# Patient Record
Sex: Female | Born: 1960 | Race: White | Hispanic: No | Marital: Married | Smoking: Former smoker
Health system: Southern US, Community
[De-identification: ages and names within clinical notes are randomized; demographics above are authoritative.]

## PROBLEM LIST (undated history)

## (undated) DIAGNOSIS — E039 Hypothyroidism, unspecified: Secondary | ICD-10-CM

## (undated) HISTORY — PX: TUBAL LIGATION: SHX77

---

## 1998-03-21 ENCOUNTER — Other Ambulatory Visit: Admission: RE | Admit: 1998-03-21 | Discharge: 1998-03-21 | Payer: Self-pay | Admitting: Obstetrics and Gynecology

## 1998-08-22 ENCOUNTER — Ambulatory Visit (HOSPITAL_COMMUNITY): Admission: RE | Admit: 1998-08-22 | Discharge: 1998-08-22 | Payer: Self-pay | Admitting: Family Medicine

## 1998-08-22 ENCOUNTER — Encounter: Payer: Self-pay | Admitting: Family Medicine

## 1999-03-27 ENCOUNTER — Other Ambulatory Visit: Admission: RE | Admit: 1999-03-27 | Discharge: 1999-03-27 | Payer: Self-pay | Admitting: Obstetrics and Gynecology

## 2000-05-17 ENCOUNTER — Other Ambulatory Visit: Admission: RE | Admit: 2000-05-17 | Discharge: 2000-05-17 | Payer: Self-pay | Admitting: Obstetrics and Gynecology

## 2001-04-04 ENCOUNTER — Encounter: Payer: Self-pay | Admitting: Obstetrics and Gynecology

## 2001-04-04 ENCOUNTER — Ambulatory Visit (HOSPITAL_COMMUNITY): Admission: RE | Admit: 2001-04-04 | Discharge: 2001-04-04 | Payer: Self-pay | Admitting: Obstetrics and Gynecology

## 2001-05-23 ENCOUNTER — Other Ambulatory Visit: Admission: RE | Admit: 2001-05-23 | Discharge: 2001-05-23 | Payer: Self-pay | Admitting: Obstetrics and Gynecology

## 2002-09-11 ENCOUNTER — Other Ambulatory Visit: Admission: RE | Admit: 2002-09-11 | Discharge: 2002-09-11 | Payer: Self-pay | Admitting: Obstetrics and Gynecology

## 2003-04-03 ENCOUNTER — Ambulatory Visit (HOSPITAL_COMMUNITY): Admission: RE | Admit: 2003-04-03 | Discharge: 2003-04-03 | Payer: Self-pay | Admitting: Obstetrics and Gynecology

## 2004-07-07 ENCOUNTER — Ambulatory Visit (HOSPITAL_COMMUNITY): Admission: RE | Admit: 2004-07-07 | Discharge: 2004-07-07 | Payer: Self-pay | Admitting: Obstetrics and Gynecology

## 2005-10-15 ENCOUNTER — Ambulatory Visit (HOSPITAL_COMMUNITY): Admission: RE | Admit: 2005-10-15 | Discharge: 2005-10-15 | Payer: Self-pay | Admitting: Obstetrics and Gynecology

## 2007-05-12 ENCOUNTER — Ambulatory Visit (HOSPITAL_COMMUNITY): Admission: RE | Admit: 2007-05-12 | Discharge: 2007-05-12 | Payer: Self-pay | Admitting: Obstetrics and Gynecology

## 2008-08-22 ENCOUNTER — Ambulatory Visit (HOSPITAL_COMMUNITY): Admission: RE | Admit: 2008-08-22 | Discharge: 2008-08-22 | Payer: Self-pay | Admitting: Obstetrics and Gynecology

## 2009-01-26 HISTORY — PX: OTHER SURGICAL HISTORY: SHX169

## 2009-04-16 ENCOUNTER — Ambulatory Visit: Payer: Self-pay | Admitting: Family Medicine

## 2009-04-16 DIAGNOSIS — M940 Chondrocostal junction syndrome [Tietze]: Secondary | ICD-10-CM | POA: Insufficient documentation

## 2009-04-16 DIAGNOSIS — R079 Chest pain, unspecified: Secondary | ICD-10-CM | POA: Insufficient documentation

## 2009-04-17 ENCOUNTER — Encounter: Payer: Self-pay | Admitting: Family Medicine

## 2009-05-09 ENCOUNTER — Ambulatory Visit: Payer: Self-pay | Admitting: Family Medicine

## 2009-05-09 DIAGNOSIS — L723 Sebaceous cyst: Secondary | ICD-10-CM | POA: Insufficient documentation

## 2009-05-09 DIAGNOSIS — R635 Abnormal weight gain: Secondary | ICD-10-CM | POA: Insufficient documentation

## 2009-05-10 ENCOUNTER — Encounter: Payer: Self-pay | Admitting: Family Medicine

## 2009-05-13 ENCOUNTER — Encounter: Payer: Self-pay | Admitting: Family Medicine

## 2009-05-13 LAB — CONVERTED CEMR LAB
Albumin: 4.5 g/dL (ref 3.5–5.2)
Alkaline Phosphatase: 82 units/L (ref 39–117)
Chloride: 103 meq/L (ref 96–112)
Cholesterol: 234 mg/dL — ABNORMAL HIGH (ref 0–200)
Creatinine, Ser: 0.8 mg/dL (ref 0.40–1.20)
Eosinophils Absolute: 0.1 10*3/uL (ref 0.0–0.7)
HDL: 80 mg/dL (ref >39)
LDL Cholesterol: 138 mg/dL — ABNORMAL HIGH (ref 0–99)
Lymphocytes Relative: 24 % (ref 12–46)
Lymphs Abs: 1.7 10*3/uL (ref 0.7–4.0)
MCV: 88 fL (ref 78.0–100.0)
Monocytes Relative: 6 % (ref 3–12)
Neutro Abs: 4.9 10*3/uL (ref 1.7–7.7)
Neutrophils Relative %: 70 % (ref 43–77)
Platelets: 211 10*3/uL (ref 150–400)
RBC: 4.4 M/uL (ref 3.87–5.11)
Sodium: 136 meq/L (ref 135–145)
Total Bilirubin: 0.5 mg/dL (ref 0.3–1.2)
Triglycerides: 82 mg/dL (ref <150)
WBC: 7 10*3/uL (ref 4.0–10.5)

## 2009-08-26 ENCOUNTER — Ambulatory Visit (HOSPITAL_COMMUNITY): Admission: RE | Admit: 2009-08-26 | Discharge: 2009-08-26 | Payer: Self-pay | Admitting: General Surgery

## 2009-12-02 ENCOUNTER — Ambulatory Visit (HOSPITAL_COMMUNITY): Admission: RE | Admit: 2009-12-02 | Discharge: 2009-12-02 | Payer: Self-pay | Admitting: Obstetrics and Gynecology

## 2009-12-09 ENCOUNTER — Telehealth: Payer: Self-pay | Admitting: Family Medicine

## 2009-12-09 DIAGNOSIS — E039 Hypothyroidism, unspecified: Secondary | ICD-10-CM | POA: Insufficient documentation

## 2009-12-11 ENCOUNTER — Encounter: Admission: RE | Admit: 2009-12-11 | Discharge: 2009-12-11 | Payer: Self-pay | Admitting: Obstetrics and Gynecology

## 2010-01-14 ENCOUNTER — Telehealth: Payer: Self-pay | Admitting: Family Medicine

## 2010-02-11 ENCOUNTER — Encounter: Payer: Self-pay | Admitting: Family Medicine

## 2010-02-12 LAB — CONVERTED CEMR LAB
Free T4: 0.76 ng/dL — ABNORMAL LOW (ref 0.80–1.80)
T3, Free: 2.5 pg/mL (ref 2.3–4.2)
TSH: 27.455 microintl units/mL — ABNORMAL HIGH (ref 0.350–4.500)

## 2010-02-15 ENCOUNTER — Encounter: Payer: Self-pay | Admitting: Obstetrics and Gynecology

## 2010-02-16 ENCOUNTER — Encounter: Payer: Self-pay | Admitting: Obstetrics and Gynecology

## 2010-02-18 ENCOUNTER — Other Ambulatory Visit: Payer: Self-pay | Admitting: Obstetrics and Gynecology

## 2010-02-18 ENCOUNTER — Encounter: Payer: Self-pay | Admitting: Family Medicine

## 2010-02-18 LAB — CONVERTED CEMR LAB
Cholesterol: 254 mg/dL
Free T4: 0.074 ng/dL
Triglycerides: 91 mg/dL

## 2010-02-20 ENCOUNTER — Encounter: Payer: Self-pay | Admitting: Family Medicine

## 2010-02-25 NOTE — Assessment & Plan Note (Signed)
Summary: Chest/back pain x 5 dys rm 1   Vital Signs:  Patient Profile:   50 Years Old Female CC:      Breast/upper back pain - L side x 5 dys Height:     62 inches Weight:      163 pounds O2 Sat:      100 % O2 treatment:    Room Air Temp:     97.1 degrees F oral Pulse rate:   80 / minute Pulse rhythm:   regular Resp:     16 per minute BP sitting:   143 / 87  (right arm) Cuff size:   regular  Vitals Entered By: Areta Haber CMA (April 16, 2009 5:42 PM)                  Current Allergies (reviewed today): ! VIOXX   History of Present Illness Chief Complaint: Breast/upper back pain - L side x 5 dys History of Present Illness: Subjective:  Patient complains of noticing a brief flutter-like sensation in her anterior chest 5 days ago.  She subsequently developed a constant dull, occasionall sharp, pain in her left anterior chest beneath her breast that radiates to her left back.  No breast pain or breast lumps.  No cough or shortness of breath.  No fevers, chills, and sweats.  No GI symptoms.  Pain not improved with Advil  Current Problems: COSTOCHONDRITIS, LEFT (ICD-733.6) CHEST PAIN, LEFT (ICD-786.50)   REVIEW OF SYSTEMS Constitutional Symptoms      Denies fever, chills, night sweats, weight loss, weight gain, and fatigue.  Eyes       Denies change in vision, eye pain, eye discharge, glasses, contact lenses, and eye surgery. Ear/Nose/Throat/Mouth       Denies hearing loss/aids, change in hearing, ear pain, ear discharge, dizziness, frequent runny nose, frequent nose bleeds, sinus problems, sore throat, hoarseness, and tooth pain or bleeding.  Respiratory       Denies dry cough, productive cough, wheezing, shortness of breath, asthma, bronchitis, and emphysema/COPD.  Cardiovascular       Complains of chest pain.      Denies murmurs and tires easily with exhertion.      Comments: Breast/back x 5 dys    Gastrointestinal       Denies stomach pain, nausea/vomiting,  diarrhea, constipation, blood in bowel movements, and indigestion. Genitourniary       Denies painful urination, kidney stones, and loss of urinary control. Neurological       Denies paralysis, seizures, and fainting/blackouts. Musculoskeletal       Denies muscle pain, joint pain, joint stiffness, decreased range of motion, redness, swelling, muscle weakness, and gout.  Skin       Denies bruising, unusual mles/lumps or sores, and hair/skin or nail changes.  Psych       Denies mood changes, temper/anger issues, anxiety/stress, speech problems, depression, and sleep problems. Other Comments: Pt has not seen PCP for this   Past History:  Past Medical History: Unremarkable  Past Surgical History: Tubal ligation Cyst removal under R Breast x 1 mth ago  Family History: Family History Lung cancer  Social History: Married Never Smoked Alcohol use-no Drug use-no Regular exercise-yes Smoking Status:  never Drug Use:  no Does Patient Exercise:  yes   Objective:  Appearance:  Patient appears healthy, stated age, and in no acute distress  Eyes:  Pupils are equal, round, and reactive to light and accomdation.  Extraocular movement is intact.  Conjunctivae are not  inflamed.  Pharynx:  Normal  Neck:  Supple.  No adenopathy is present.  No thyromegaly is present  Lungs:  Clear to auscultation.  Breath sounds are equal.  Chest:  vague mild tenderness to left of sternum Left breast:  no tenderness or nodules Heart:  Regular rate and rhythm without murmurs, rubs, or gallops.  Abdomen:  Nontender without masses or hepatosplenomegaly.  Bowel sounds are present.  No CVA or flank tenderness.  EKG:  normal Chest X-ray:  negative Assessment New Problems: COSTOCHONDRITIS, LEFT (ICD-733.6) CHEST PAIN, LEFT (ICD-786.50)  SUSPECT COSTOCHONDRITIS  Plan New Orders: EKG w/ Interpretation [93000] T-Chest x-ray, 2 views [71020] New Patient Level III [16109] Planning Comments:   Begin Advil  3 or 4 tabs every 8 hours with food.  Heating pad.  Netter info sheet given on topic. Follow-up with PCP if symptoms worsen or not improving 10 to 14 days.   The patient and/or caregiver has been counseled thoroughly with regard to medications prescribed including dosage, schedule, interactions, rationale for use, and possible side effects and they verbalize understanding.  Diagnoses and expected course of recovery discussed and will return if not improved as expected or if the condition worsens. Patient and/or caregiver verbalized understanding.

## 2010-02-25 NOTE — Assessment & Plan Note (Signed)
Summary: NOV L chest wall pain   Vital Signs:  Patient profile:   50 year old female Height:      62 inches Weight:      161 pounds BMI:     29.55 O2 Sat:      99 % on Room air Temp:     98.0 degrees F oral Pulse rate:   79 / minute BP sitting:   129 / 83  (left arm) Cuff size:   regular  Vitals Entered By: Payton Spark CMA (May 09, 2009 10:46 AM)  O2 Flow:  Room air CC: New Pt to est. F/U UC. Also c/o sinus congestion.    Primary Care Provider:  Seymour Bars DO  CC:  New Pt to est. F/U UC. Also c/o sinus congestion. Marland Kitchen  History of Present Illness: 50 yo WF presents for NOV.  She is previously healthy.  She is G2P2002.  Her youngest (son) lives at home.    She has been seen by UC  last month with chest pain that were shooting thru to her back.  She was also having heart palpitations.  UC diagnosed her with costochondrititis.  Her chest pain started to improve but then she started to have cold symptoms and the chest pain worsened again.    She denies any SOB and is not longer having heart palpitations.  She admits to only taking Advil for 3 days.  Her pain is constant and dull.  No nausea.  Not worse after eating, lifting or with deep breathing.    Her cough is mild not keeping her up at night.  She had 100.2 temp on Friday.  She denies sinus pain or pressure.  she has postnasal drip and some mild clear rhinorrhea, sore throat.  Denies hemoptysis or sputum production.  She quit smoking in 1984 and had a normal CXR and EKG last month.    She denies heavy lifting or reproducible symtpoms.  She has a burning pain in the L shoulder blade but no neck pain or arm pain.  Denies nausea or reflux symptoms.    Current Medications (verified): 1)  None  Allergies (verified): 1)  ! Vioxx  Past History:  Past Medical History: [place] [facility] G2P2, perimenopausal  Past Surgical History: Tubal ligation Cyst removal under R Breast 01-2009  Family History: mother died of lung cancer at  age 43 father unknown sister healthy  Social History: Married to [place], has 2 kids Quit smoking in 1984.   Alcohol use-no Drug use-no No exercise.  Review of Systems       no fevers/sweats/weakness, unexplained wt loss/gain, no change in vision, + difficulty hearing, ringing in ears, + hay fever/allergies, + CP/discomfort, no palpitations, no breast lump/nipple discharge, + cough/wheeze, no blood in stool,no  N/V/D, no nocturia, no leaking urine, no unusual vag bleeding, no vaginal/penile discharge, no muscle/joint pain, no rash, no new/changing mole, no HA, no memory loss, no anxiety, no sleep problem, no depression, no unexplained lumps, no easy bruising/bleeding, no concern with sexual function   Physical Exam  General:  alert, well-developed, well-nourished, well-hydrated, and overweight-appearing.   Head:  normocephalic and atraumatic.   Eyes:  pupils equal, pupils round, and pupils reactive to light.  sclera non icteric Nose:  no nasal discharge.   Mouth:  good dentition and pharynx pink and moist.   Neck:  no masses.   Lungs:  Normal respiratory effort, chest expands symmetrically. Lungs are clear to auscultation, no crackles or wheezes. Heart:  Normal rate and regular rhythm. S1 and S2 normal without gallop, murmur, click, rub or other extra sounds. Abdomen:  Bowel sounds positive,abdomen soft and non-tender without masses, organomegaly or hernias noted. Msk:  no reproducible chest wall or L scapular pain with full C spine and LUE ROM.  Extremities:  no LE edema Skin:  color normal.   0.6 cm epidermal cyst over the R anterior neck Cervical Nodes:  No lymphadenopathy noted Psych:  good eye contact, not anxious appearing, and not depressed appearing.     Impression & Recommendations:  Problem # 1:  CHEST PAIN, LEFT (ICD-786.50) By history and physical exam, she seems to have either L sided costochondritis or referral L chest/ L scapular pain.  Her pain is not  reproducible on exam, her lungs and pulse ox are perfect.  She had a normal CXR and EKG last month, reviewed today.  No worrisome features and definitely atypical.  Will obtain labs today and empirically try her on samples of Nexium since silent reflux can induce L shoulder/ L chest wall pain.   Orders: T-CBC w/Diff ([id number])  Problem # 2:  EPIDERMOID CYST (ICD-706.2) Referral made to dermatology for removal.  Orders: Dermatology Referral (Derma)  Other Orders: T-Comprehensive Metabolic Panel [phone]) T-Lipid Profile ([id number]) T-TSH ([phone])  Patient Instructions: 1)  Start Nexium 1 capsule by mouth daily, take 30 min before dinner. 2)  Labs today. 3)  Will call you w/ results tomorrow. 4)  OK to use Advil 2-3 tabs 3 x a day with meals for chest wall pain.

## 2010-02-25 NOTE — Miscellaneous (Signed)
Summary: 2D echo  Clinical Lists Changes  Orders: Added new Test order of T-2-D Echo Complete (95621) - Signed

## 2010-02-25 NOTE — Progress Notes (Signed)
Summary: Lab order request   Phone Note Call from Patient   Caller: Patient Summary of Call: Pt. called and states that the last time she was here Dr. Cathey Endow told her to come back to get labs drawn so she just wants to pick up her lab order..... Call her back when her lab order is ready. Call pt. back at [phone] Initial call taken by: Michaelle Copas,  December 09, 2009 1:56 PM  New Problems: UNSPECIFIED HYPOTHYROIDISM (ICD-244.9)   New Problems: UNSPECIFIED HYPOTHYROIDISM (ICD-244.9)  Appended Document: Lab order request  Faxed order to lab. No answer at above number

## 2010-02-27 NOTE — Progress Notes (Signed)
Summary: wants labs  Phone Note Call from Patient   Caller: Patient Call For: Seymour Bars DO Summary of Call: Pt calls and wants get complete bloodwork done. Never went in November to recheck her thyroid but would like to go sometime this week and get everything done. Please let pt know cell# [phone] or work # [phone] Initial call taken by: Kathlene November LPN,  January 14, 2010 1:02 PM  Follow-up for Phone Call        had a normal CMP and FLP 04-2009, so insurance will not pay to repeat this in <1 yr.  She is due to recheck her thryoid.  lab order printed. Follow-up by: Seymour Bars DO,  January 14, 2010 1:04 PM

## 2010-02-27 NOTE — Miscellaneous (Signed)
Summary: labs from gyn  Clinical Lists Changes  Observations: Added new observation of T4, FREE: .074 ng/dL (81/19/1478 29:56) Added new observation of LDL: 140 mg/dL (21/30/8657 84:69) Added new observation of HDL: 96 mg/dL (62/95/2841 32:44) Added new observation of TRIGLYC TOT: 91 mg/dL (01/28/7251 66:44) Added new observation of CHOLESTEROL: 254 mg/dL (03/47/4259 56:38)

## 2010-03-11 ENCOUNTER — Telehealth (INDEPENDENT_AMBULATORY_CARE_PROVIDER_SITE_OTHER): Payer: Self-pay | Admitting: *Deleted

## 2010-03-19 NOTE — Progress Notes (Signed)
Summary: TSH order   

## 2010-03-28 ENCOUNTER — Ambulatory Visit: Payer: Self-pay | Admitting: Family Medicine

## 2010-03-28 ENCOUNTER — Encounter: Payer: Self-pay | Admitting: Family Medicine

## 2010-03-31 ENCOUNTER — Encounter: Payer: Self-pay | Admitting: Family Medicine

## 2010-03-31 ENCOUNTER — Ambulatory Visit (INDEPENDENT_AMBULATORY_CARE_PROVIDER_SITE_OTHER): Payer: BC Managed Care – PPO | Admitting: Family Medicine

## 2010-03-31 DIAGNOSIS — E039 Hypothyroidism, unspecified: Secondary | ICD-10-CM

## 2010-04-01 ENCOUNTER — Encounter: Payer: Self-pay | Admitting: Family Medicine

## 2010-04-08 NOTE — Assessment & Plan Note (Signed)
Summary: f/u thyroid   Vital Signs:  Patient profile:   50 year old female Height:      62 inches Weight:      170 pounds BMI:     31.21 O2 Sat:      100 % on Room air Pulse rate:   93 / minute BP sitting:   128 / 79  (left arm) Cuff size:   regular  Vitals Entered By: Payton Spark CMA (March 31, 2010 3:21 PM)  O2 Flow:  Room air CC: F/U thyroid labs   Primary Care Provider:  Seymour Bars DO  CC:  F/U thyroid labs.  History of Present Illness: 50 yo WF presents for f/u abnormal thyroid labs.  Last year, her TSH came back high and her Free T3 and T4 were low.  I added RX Levothyroxine but she never started it.  She did not call back w/ any questions.  Just recently, I repeated her labs and they were again abnromal.  She has been c/o hair loss, wt gain and being cold all the time.  Denies a fam hx of thyroid d/o.      Allergies: 1)  ! Vioxx  Past History:  Past Medical History: Reviewed history from 05/09/2009 and no changes required. [place] [facility] G2P2, perimenopausal  Past Surgical History: Reviewed history from 05/09/2009 and no changes required. Tubal ligation Cyst removal under R Breast 01-2009  Family History: Reviewed history from 05/09/2009 and no changes required. mother died of lung cancer at age 79 father unknown sister healthy  Social History: Reviewed history from 05/09/2009 and no changes required. Married to [place], has 2 kids Quit smoking in 1984.   Alcohol use-no Drug use-no No exercise.  Review of Systems      See HPI  Physical Exam  General:  alert, well-developed, well-nourished, and well-hydrated.   Head:  normocephalic, atraumatic, and no alopecia.   Eyes:  no proptosis, pupils equal, pupils round, and pupils reactive to light.   Mouth:  pharynx pink and moist.   Neck:  no thyromegaly.   Lungs:  Normal respiratory effort, chest expands symmetrically. Lungs are clear to auscultation, no crackles or wheezes. Heart:  Normal rate and  regular rhythm. S1 and S2 normal without gallop, murmur, click, rub or other extra sounds. Extremities:  no pretibial edema Skin:  color normal.   Cervical Nodes:  No lymphadenopathy noted Psych:  good eye contact, not anxious appearing, and not depressed appearing.     Impression & Recommendations:  Problem # 1:  UNSPECIFIED HYPOTHYROIDISM (ICD-244.9) Explained to pt what Hypothyroidism is and the importance of treating her.  She likely has Hashimotos.  Will add a thyroid ab onto recent labs. H/o given to pt.  Will start branded Synthroid once daily and look for improvement in her symptoms.  Recheck in 2 mos with a repeat TSH.   Her updated medication list for this problem includes:    Synthroid 25 Mcg Tabs (Levothyroxine sodium) .Marland Kitchen... 1 tab by mouth daily on empty stomach  Orders: T-Thyroglobulin Antibody ([id number])  Labs Reviewed: TSH: 43.499 (03/28/2010)    Chol: 254 (02/18/2010)   HDL: 96 (02/18/2010)   LDL: 140 (02/18/2010)   TG: 91 (02/18/2010)  Complete Medication List: 1)  Synthroid 25 Mcg Tabs (Levothyroxine sodium) .Marland Kitchen.. 1 tab by mouth daily on empty stomach  Patient Instructions: 1)  Start Synthroid each AM, 30 min before breakfast. 2)  Return for f/u visit in 2 mos to recheck lab. Prescriptions: SYNTHROID  25 MCG TABS (LEVOTHYROXINE SODIUM) 1 tab by mouth daily on empty stomach Brand medically necessary #30 x 1   Entered and Authorized by:   Seymour Bars DO   Signed by:   Seymour Bars DO on 03/31/2010   Method used:   Electronically to        [facility] [phone]* (retail)       [address] [place], [place]  [address]       Ph: [phone] or [phone]       Fax: [phone]   RxID:   [id number]    Orders Added: 1)  T-Thyroglobulin Antibody [[id number]] 2)  Est. Patient Level III [41660]

## 2010-04-11 LAB — PREGNANCY, URINE: Preg Test, Ur: NEGATIVE

## 2010-04-11 LAB — SURGICAL PCR SCREEN
MRSA, PCR: NEGATIVE
Staphylococcus aureus: NEGATIVE

## 2010-05-23 ENCOUNTER — Encounter: Payer: Self-pay | Admitting: Family Medicine

## 2010-05-26 ENCOUNTER — Ambulatory Visit: Payer: BC Managed Care – PPO | Admitting: Family Medicine

## 2010-05-27 ENCOUNTER — Ambulatory Visit: Payer: BC Managed Care – PPO | Admitting: Family Medicine

## 2010-05-30 ENCOUNTER — Encounter: Payer: Self-pay | Admitting: Family Medicine

## 2010-05-30 ENCOUNTER — Ambulatory Visit (INDEPENDENT_AMBULATORY_CARE_PROVIDER_SITE_OTHER): Payer: BC Managed Care – PPO | Admitting: Family Medicine

## 2010-05-30 DIAGNOSIS — Z130 Encounter for screening for diseases of the blood and blood-forming organs and certain disorders involving the immune mechanism: Secondary | ICD-10-CM

## 2010-05-30 DIAGNOSIS — E039 Hypothyroidism, unspecified: Secondary | ICD-10-CM

## 2010-05-30 DIAGNOSIS — Z13228 Encounter for screening for other metabolic disorders: Secondary | ICD-10-CM

## 2010-05-30 NOTE — Patient Instructions (Signed)
Labs today. Will call you w/ results on Monday.  Return for f/u in 6 mos.

## 2010-05-30 NOTE — Assessment & Plan Note (Signed)
High thyroglobulin antibodies in March indicating cause for hypothyroidism to be Hashimoto's. She is doing well on Synthroid but is due do have her TSH checked today.  Will adjust her dose as indicated.

## 2010-05-30 NOTE — Progress Notes (Signed)
  Subjective:    Patient ID: Kelly Wheeler, female    DOB: 07-10-1960, 50 y.o.   MRN: [id number]  HPI  50 yo WF presents for f/u new diagnosis of Hashimoto's.  She was started on Synthroid 25 mcg once daily 2 mos ago and is due for TSH today.  Feels well.  No problems.  Due for a chemistry panel today but had her FLP checked in Jan 2012 by her gyn.  Her LDL was 140.  She is not a smoker and has no cardiac risk factors.  She'd like to work on diet, exercise and wt loss.  BP 115/77  Pulse 87  Ht 5\' 4"  (1.626 m)  Wt 173 lb (78.472 kg)  BMI 29.70 kg/m2  SpO2 98%  LMP 05/09/2010    Review of Systems  Constitutional: Negative for fever, fatigue and unexpected weight change.  Respiratory: Negative for shortness of breath.   Cardiovascular: Negative for chest pain, palpitations and leg swelling.       Objective:   Physical Exam  Constitutional: She appears well-developed and well-nourished.  Neck: Thyromegaly (minimal thyromegaly w/o palpable nodules or tendernss.  no bruits audible over thyroid gland) present.  Cardiovascular: Normal rate and normal heart sounds.   Pulmonary/Chest: Effort normal and breath sounds normal.  Musculoskeletal: She exhibits no edema.  Psychiatric: She has a normal mood and affect.          Assessment & Plan:

## 2010-05-31 ENCOUNTER — Telehealth: Payer: Self-pay | Admitting: Family Medicine

## 2010-05-31 LAB — COMPLETE METABOLIC PANEL WITH GFR
ALT: 50 U/L — ABNORMAL HIGH (ref 0–35)
AST: 55 U/L — ABNORMAL HIGH (ref 0–37)
Albumin: 4.4 g/dL (ref 3.5–5.2)
Alkaline Phosphatase: 97 U/L (ref 39–117)
Potassium: 4.4 mEq/L (ref 3.5–5.3)
Sodium: 142 mEq/L (ref 135–145)
Total Bilirubin: 0.4 mg/dL (ref 0.3–1.2)
Total Protein: 7 g/dL (ref 6.0–8.3)

## 2010-05-31 LAB — TSH: TSH: 18.343 u[IU]/mL — ABNORMAL HIGH (ref 0.350–4.500)

## 2010-05-31 MED ORDER — LEVOTHYROXINE SODIUM 50 MCG PO TABS: 50.0000 ug | ORAL_TABLET | Freq: Every day | ORAL | Status: DC

## 2010-05-31 NOTE — Telephone Encounter (Signed)
Pls let pt know that her TSH has improved but not at goal yet.  Her Liver enzymes were a little high.  Avoid alcohol > 3 drinks/ wk and avoid tyelnol containgin products.  i will send a higher dose of synthroid to the pharmacy.  Repeat TSH and Liver enzymes in 6 wks.

## 2010-06-02 NOTE — Telephone Encounter (Signed)
LMOM informing Pt of the below 

## 2010-07-07 ENCOUNTER — Other Ambulatory Visit: Payer: Self-pay | Admitting: Family Medicine

## 2010-07-07 ENCOUNTER — Telehealth: Payer: Self-pay | Admitting: *Deleted

## 2010-07-07 DIAGNOSIS — Z79899 Other long term (current) drug therapy: Secondary | ICD-10-CM

## 2010-07-07 DIAGNOSIS — E0590 Thyrotoxicosis, unspecified without thyrotoxic crisis or storm: Secondary | ICD-10-CM

## 2010-07-07 NOTE — Telephone Encounter (Signed)
Labs entered.

## 2010-07-10 ENCOUNTER — Telehealth: Payer: Self-pay | Admitting: Family Medicine

## 2010-07-10 LAB — HEPATIC FUNCTION PANEL
ALT: 44 U/L — ABNORMAL HIGH (ref 0–35)
Alkaline Phosphatase: 98 U/L (ref 39–117)
Bilirubin, Direct: 0.1 mg/dL (ref 0.0–0.3)
Indirect Bilirubin: 0.4 mg/dL (ref 0.0–0.9)
Total Protein: 7.2 g/dL (ref 6.0–8.3)

## 2010-07-10 MED ORDER — LEVOTHYROXINE SODIUM 88 MCG PO TABS: 88.0000 ug | ORAL_TABLET | Freq: Every day | ORAL | Status: DC

## 2010-07-10 NOTE — Telephone Encounter (Signed)
Pls let pt know that we still need to go up on her dose of Levothyroxine, though her numbers are starting to improve. New Rx for Levothyroxine 88 micrograms sent to her pharmacy to start.    Recheck TSH in 8 wks.

## 2010-07-10 NOTE — Telephone Encounter (Signed)
Message copied by Glennis Brink on Thu Jul 10, 2010  1:33 PM ------      Message from: MILLS, MICHELLE P      Created: Thu Jul 10, 2010 11:50 AM                   ----- Message -----         From: Lab In [place] Interface         Sent: 07/09/2010  10:29 PM           To: Payton Spark, CMA

## 2010-07-10 NOTE — Telephone Encounter (Signed)
LMOM informing Pt of the above 

## 2010-07-13 ENCOUNTER — Telehealth: Payer: Self-pay | Admitting: Family Medicine

## 2010-07-13 DIAGNOSIS — R748 Abnormal levels of other serum enzymes: Secondary | ICD-10-CM

## 2010-07-13 NOTE — Telephone Encounter (Signed)
Pls let pt know that her liver enzymes have come down but are still above normal.  If she has not had an ultrasound of her liver yet, will proceed with ordering this downstairs.

## 2010-07-14 NOTE — Telephone Encounter (Signed)
Pt aware of the above  

## 2010-07-21 ENCOUNTER — Ambulatory Visit
Admission: RE | Admit: 2010-07-21 | Discharge: 2010-07-21 | Disposition: A | Discharge status: Home / Self Care | Payer: BC Managed Care – PPO | Source: Ambulatory Visit | Attending: Family Medicine | Admitting: Family Medicine

## 2010-07-21 DIAGNOSIS — R748 Abnormal levels of other serum enzymes: Secondary | ICD-10-CM

## 2010-07-23 ENCOUNTER — Telehealth: Payer: Self-pay | Admitting: Family Medicine

## 2010-07-23 DIAGNOSIS — R748 Abnormal levels of other serum enzymes: Secondary | ICD-10-CM

## 2010-07-23 NOTE — Telephone Encounter (Signed)
Pls let pt know that her ultrasound came back normal.  The next step is to screen for hepatitis B and C.  I will print out lab order.

## 2010-07-25 NOTE — Telephone Encounter (Signed)
Pt aware of the above  

## 2010-07-26 LAB — HEPATITIS B CORE ANTIBODY, TOTAL: Hep B Core Total Ab: NEGATIVE

## 2010-07-27 ENCOUNTER — Telehealth: Payer: Self-pay | Admitting: Family Medicine

## 2010-07-27 NOTE — Telephone Encounter (Signed)
Pls let pt know that her testing for hepatitis B and C came back NEGATIVE.  Will just continue to follow since liver enzymes are only mildly high.  Recheck in 6 mos.

## 2010-07-28 NOTE — Telephone Encounter (Signed)
LMOM informing Pt of the above 

## 2010-11-01 ENCOUNTER — Other Ambulatory Visit: Payer: Self-pay | Admitting: Family Medicine

## 2010-11-03 ENCOUNTER — Other Ambulatory Visit: Payer: Self-pay | Admitting: *Deleted

## 2010-11-03 MED ORDER — LEVOTHYROXINE SODIUM 88 MCG PO TABS: 88.0000 ug | ORAL_TABLET | Freq: Every day | ORAL | Status: DC

## 2010-12-02 ENCOUNTER — Ambulatory Visit: Payer: BC Managed Care – PPO | Admitting: Family Medicine

## 2011-02-26 ENCOUNTER — Other Ambulatory Visit: Payer: Self-pay | Admitting: Family Medicine

## 2011-03-02 ENCOUNTER — Other Ambulatory Visit: Payer: Self-pay | Admitting: Family Medicine

## 2011-03-02 NOTE — Telephone Encounter (Signed)
Needs appointment

## 2011-04-06 ENCOUNTER — Ambulatory Visit (INDEPENDENT_AMBULATORY_CARE_PROVIDER_SITE_OTHER): Payer: BC Managed Care – PPO | Admitting: Family Medicine

## 2011-04-06 ENCOUNTER — Encounter: Payer: Self-pay | Admitting: Family Medicine

## 2011-04-06 VITALS — BP 117/70 | HR 76 | Ht 64.0 in | Wt 176.0 lb

## 2011-04-06 DIAGNOSIS — Z1211 Encounter for screening for malignant neoplasm of colon: Secondary | ICD-10-CM

## 2011-04-06 DIAGNOSIS — E039 Hypothyroidism, unspecified: Secondary | ICD-10-CM

## 2011-04-06 DIAGNOSIS — E785 Hyperlipidemia, unspecified: Secondary | ICD-10-CM

## 2011-04-06 MED ORDER — SYNTHROID 88 MCG PO TABS: 88.0000 ug | ORAL_TABLET | Freq: Every day | ORAL | Status: DC

## 2011-04-06 NOTE — Patient Instructions (Signed)
We will call you with your lab results. If you don't here from us in about a week then please give us a call at [phone].  

## 2011-04-06 NOTE — Progress Notes (Signed)
  Subjective:    Patient ID: Kelly Wheeler, female    DOB: 07-17-1960, 51 y.o.   MRN: [id number]  HPI Hypothyroid - No skin or hair changes. No weight changes. Says taking it every day. She will need refills unti lcan get labs.  She is happy with her current regime Lab Results  Component Value Date   TSH 21.817* 07/07/2010      Review of Systems     Objective:   Physical Exam  Constitutional: She is oriented to person, place, and time. She appears well-developed and well-nourished.  HENT:  Head: Normocephalic and atraumatic.  Neck: Neck supple. No thyromegaly present.  Cardiovascular: Normal rate, regular rhythm and normal heart sounds.   Pulmonary/Chest: Effort normal and breath sounds normal.  Lymphadenopathy:    She has no cervical adenopathy.  Neurological: She is alert and oriented to person, place, and time.  Skin: Skin is warm and dry.  Psychiatric: She has a normal mood and affect. Her behavior is normal.          Assessment & Plan:  Hypothyroid - Due to recheck to make sure dose is adequate.  She will go for fasting lab work either this week or next week. I will send over a 14 day supply. She is asymptomatic and her exam is normal today.  Colon ca screening. We discussed options for colon cancer screening. She said she like to move forward with the colonoscopy. We will make referral. She prefers to see Dr. Madilyn Fireman of our GI in [place]. This is her husbands gastrologist.  Hyperlipidemia-She is also due for her recheck on her lipids. Her LDL was elevated last year.

## 2011-04-10 ENCOUNTER — Other Ambulatory Visit: Payer: Self-pay | Admitting: Family Medicine

## 2011-04-10 LAB — COMPLETE METABOLIC PANEL WITH GFR
ALT: 34 U/L (ref 0–35)
Albumin: 4.7 g/dL (ref 3.5–5.2)
Alkaline Phosphatase: 117 U/L (ref 39–117)
CO2: 26 mEq/L (ref 19–32)
GFR, Est African American: 81 mL/min
GFR, Est Non African American: 70 mL/min
Glucose, Bld: 95 mg/dL (ref 70–99)
Potassium: 4.2 mEq/L (ref 3.5–5.3)
Sodium: 140 mEq/L (ref 135–145)
Total Bilirubin: 0.7 mg/dL (ref 0.3–1.2)
Total Protein: 7.1 g/dL (ref 6.0–8.3)

## 2011-04-10 LAB — LIPID PANEL
HDL: 85 mg/dL (ref >39)
LDL Cholesterol: 179 mg/dL — ABNORMAL HIGH (ref 0–99)
Total CHOL/HDL Ratio: 3.4 Ratio

## 2011-04-10 LAB — T4, FREE: Free T4: 0.99 ng/dL (ref 0.80–1.80)

## 2011-04-10 MED ORDER — SYNTHROID 100 MCG PO TABS: 100.0000 ug | ORAL_TABLET | Freq: Every day | ORAL | Status: DC

## 2011-04-10 MED ORDER — ATORVASTATIN CALCIUM 40 MG PO TABS: 40.0000 mg | ORAL_TABLET | Freq: Every day | ORAL | Status: AC

## 2011-07-20 ENCOUNTER — Other Ambulatory Visit: Payer: Self-pay | Admitting: Family Medicine

## 2011-10-14 ENCOUNTER — Telehealth: Payer: Self-pay | Admitting: Family Medicine

## 2011-10-14 DIAGNOSIS — E039 Hypothyroidism, unspecified: Secondary | ICD-10-CM

## 2011-10-14 DIAGNOSIS — E785 Hyperlipidemia, unspecified: Secondary | ICD-10-CM

## 2011-10-14 NOTE — Telephone Encounter (Signed)
Patient called request to have lab order sent down for basic labs cholesterol and thyroid on 10/22/11. Pt has an appointment on 10/23/11. Thanks

## 2011-10-14 NOTE — Telephone Encounter (Signed)
Labs request sent to lab for TSH and Lipid profile

## 2011-10-23 ENCOUNTER — Ambulatory Visit: Payer: BC Managed Care – PPO | Admitting: Family Medicine

## 2011-10-26 ENCOUNTER — Encounter: Payer: Self-pay | Admitting: Family Medicine

## 2011-10-26 ENCOUNTER — Other Ambulatory Visit: Payer: Self-pay | Admitting: Family Medicine

## 2011-10-26 ENCOUNTER — Ambulatory Visit (INDEPENDENT_AMBULATORY_CARE_PROVIDER_SITE_OTHER): Payer: BC Managed Care – PPO | Admitting: Family Medicine

## 2011-10-26 VITALS — BP 135/88 | HR 91 | Wt 170.0 lb

## 2011-10-26 DIAGNOSIS — E785 Hyperlipidemia, unspecified: Secondary | ICD-10-CM

## 2011-10-26 DIAGNOSIS — E039 Hypothyroidism, unspecified: Secondary | ICD-10-CM

## 2011-10-26 NOTE — Progress Notes (Signed)
  Subjective:    Patient ID: Kelly Wheeler, female    DOB: 02-01-1960, 51 y.o.   MRN: [id number]  HPI Hypothyroid - She has noticed some increased hair loss over the last 3 motnhs. We increased her dose to 6 months ago.  Noticed nigthsweats as well.  No periods. Taking med on empty stomach 1 hour before breakfast.   Hyperlipidemia- No SE and doing well. Due to recheck Started it 6 months.    Review of Systems     Objective:   Physical Exam  Constitutional: She is oriented to person, place, and time. She appears well-developed and well-nourished.  HENT:  Head: Normocephalic and atraumatic.  Neck: Neck supple. No thyromegaly present.  Cardiovascular: Normal rate, regular rhythm and normal heart sounds.   Pulmonary/Chest: Effort normal and breath sounds normal.  Lymphadenopathy:    She has no cervical adenopathy.  Neurological: She is alert and oriented to person, place, and time.  Skin: Skin is warm and dry.  Psychiatric: She has a normal mood and affect. Her behavior is normal.          Assessment & Plan:  Hypothyroid - Do to make sure levels are accurate since we increased her dose 6 months ago.  Hyperlipidemia - will recheck lipids and make sure at goal.   Flu vaccine declined.

## 2011-10-30 LAB — LIPID PANEL
HDL: 81 mg/dL (ref >39)
LDL Cholesterol: 135 mg/dL — ABNORMAL HIGH (ref 0–99)
Triglycerides: 142 mg/dL (ref <150)
VLDL: 28 mg/dL (ref 0–40)

## 2011-10-30 LAB — COMPLETE METABOLIC PANEL WITH GFR
ALT: 42 U/L — ABNORMAL HIGH (ref 0–35)
AST: 29 U/L (ref 0–37)
CO2: 25 mEq/L (ref 19–32)
Chloride: 104 mEq/L (ref 96–112)
Creat: 1.1 mg/dL (ref 0.50–1.10)
GFR, Est African American: 67 mL/min
Sodium: 139 mEq/L (ref 135–145)
Total Bilirubin: 0.7 mg/dL (ref 0.3–1.2)
Total Protein: 6.7 g/dL (ref 6.0–8.3)

## 2011-11-01 ENCOUNTER — Other Ambulatory Visit: Payer: Self-pay | Admitting: Family Medicine

## 2011-11-01 MED ORDER — SYNTHROID 125 MCG PO TABS: 125.0000 ug | ORAL_TABLET | Freq: Every day | ORAL | Status: DC

## 2011-12-30 ENCOUNTER — Other Ambulatory Visit: Payer: Self-pay | Admitting: Family Medicine

## 2012-01-08 ENCOUNTER — Other Ambulatory Visit: Payer: Self-pay | Admitting: Family Medicine

## 2012-01-26 ENCOUNTER — Emergency Department (INDEPENDENT_AMBULATORY_CARE_PROVIDER_SITE_OTHER)
Admission: EM | Admit: 2012-01-26 | Discharge: 2012-01-26 | Disposition: A | Discharge status: Home / Self Care | Payer: BC Managed Care – PPO | Source: Home / Self Care | Attending: Family Medicine | Admitting: Family Medicine

## 2012-01-26 ENCOUNTER — Telehealth (INDEPENDENT_AMBULATORY_CARE_PROVIDER_SITE_OTHER): Payer: Self-pay | Admitting: General Surgery

## 2012-01-26 ENCOUNTER — Encounter: Payer: Self-pay | Admitting: Emergency Medicine

## 2012-01-26 DIAGNOSIS — L03319 Cellulitis of trunk, unspecified: Secondary | ICD-10-CM

## 2012-01-26 DIAGNOSIS — L03313 Cellulitis of chest wall: Secondary | ICD-10-CM

## 2012-01-26 DIAGNOSIS — L02219 Cutaneous abscess of trunk, unspecified: Secondary | ICD-10-CM

## 2012-01-26 MED ORDER — DOXYCYCLINE HYCLATE 100 MG PO CAPS: 100.0000 mg | ORAL_CAPSULE | Freq: Two times a day (BID) | ORAL | Status: DC

## 2012-01-26 NOTE — Telephone Encounter (Signed)
Pt called for appt; has another developing abscess on Rt breast (underside.)  Explained to her the office will close early today for holiday and will not reopen until day after tomorrow.  Suggested she contact her PCP for antibiotic, which may keep the abscess from developing to the point of needing to be opened and drained.  Pt understands and will call early on Thursday if she needs appt for I&D.

## 2012-01-26 NOTE — ED Provider Notes (Signed)
History     CSN: [id number]  Arrival date & time 01/26/12  1331   First MD Initiated Contact with Patient 01/26/12 1423      Chief Complaint  Patient presents with  . Recurrent Skin Infections      HPI Comments: Patient believes that she may have a recurrent abscess under right breast.  She has had an area of redness and soreness at a previous abscess site,  first drained by surgeon in 2011.  No fevers, chills, and sweats.  She feels well otherwise.  Patient is a 51 y.o. female presenting with abscess.  Abscess  This is a recurrent problem. Episode onset: 3 days ago. The problem has been gradually worsening. Affected Location: beneath right breast. The problem is mild. The abscess is characterized by redness (soreness).    History reviewed. No pertinent past medical history.  Past Surgical History  Procedure Date  . Tubal ligation   . Cyst removed under right breast 1-11    Family History  Problem Relation Age of Onset  . Cancer Mother     Lung    History  Substance Use Topics  . Smoking status: Former Smoker    Quit date: 01/26/1982  . Smokeless tobacco: Not on file  . Alcohol Use: No    OB History    Grav Para Term Preterm Abortions TAB SAB Ect Mult Living                  Review of Systems  All other systems reviewed and are negative.    Allergies  Rofecoxib  Home Medications   Current Outpatient Rx  Name  Route  Sig  Dispense  Refill  . ATORVASTATIN CALCIUM 40 MG PO TABS   Oral   Take 1 tablet (40 mg total) by mouth daily.   30 tablet   11   . DOXYCYCLINE HYCLATE 100 MG PO CAPS   Oral   Take 1 capsule (100 mg total) by mouth 2 (two) times daily.   20 capsule   0   . SYNTHROID 125 MCG PO TABS      TAKE 1 TABLET (125 MCG TOTAL) BY MOUTH DAILY.   30 tablet   1     Dispense as written.     BP 130/77  Pulse 84  Temp 97.6 F (36.4 C) (Oral)  Resp 16  Ht 5\' 4"  (1.626 m)  Wt 173 lb (78.472 kg)  BMI 29.70 kg/m2  SpO2 98%  LMP  05/09/2010  Physical Exam  Nursing note and vitals reviewed. Constitutional: She appears well-developed and well-nourished. No distress.  Eyes: Conjunctivae normal are normal. Pupils are equal, round, and reactive to light.  Skin:          In the intertriginous area beneath the right breast is an area of mild erythema about 1cm by 2cm over a scar from previous I and D.  The area is slightly tender but not fluctuant.    ED Course  Procedures none      1. Cellulitis of chest wall       MDM  Begin doxycycline. Begin applying warm compresses 3 or 4 times daily.  Return for increasing pain, swelling, redness. Followup with Family Doctor if not improved in one week.         Lattie Haw, MD 01/26/12 [phone]

## 2012-01-26 NOTE — Discharge Instructions (Signed)
Begin applying warm compresses 3 or 4 times daily.  Return for increasing pain, swelling, redness.   Cellulitis Cellulitis is an infection of the skin and the tissue beneath it. The infected area is usually red and tender. Cellulitis occurs most often in the arms and lower legs.  CAUSES  Cellulitis is caused by bacteria that enter the skin through cracks or cuts in the skin. The most common types of bacteria that cause cellulitis are Staphylococcus and Streptococcus. SYMPTOMS   Redness and warmth.  Swelling.  Tenderness or pain.  Fever. DIAGNOSIS  Your caregiver can usually determine what is wrong based on a physical exam. Blood tests may also be done. TREATMENT  Treatment usually involves taking an antibiotic medicine. HOME CARE INSTRUCTIONS   Take your antibiotics as directed. Finish them even if you start to feel better.  Keep the infected arm or leg elevated to reduce swelling.  Apply a warm cloth to the affected area up to 4 times per day to relieve pain.  Only take over-the-counter or prescription medicines for pain, discomfort, or fever as directed by your caregiver.  Keep all follow-up appointments as directed by your caregiver. SEEK MEDICAL CARE IF:   You notice red streaks coming from the infected area.  Your red area gets larger or turns dark in color.  Your bone or joint underneath the infected area becomes painful after the skin has healed.  Your infection returns in the same area or another area.  You notice a swollen bump in the infected area.  You develop new symptoms. SEEK IMMEDIATE MEDICAL CARE IF:   You have a fever.  You feel very sleepy.  You develop vomiting or diarrhea.  You have a general ill feeling (malaise) with muscle aches and pains. MAKE SURE YOU:   Understand these instructions.  Will watch your condition.  Will get help right away if you are not doing well or get worse. Document Released: 10/22/2004 Document Revised:  07/14/2011 Document Reviewed: 03/30/2011 [facility] Patient Information 2013 [place], [place].

## 2012-01-26 NOTE — ED Notes (Signed)
Recurrent abscess under right breast x 3 days, first drained by surgeon in 2011

## 2012-01-28 ENCOUNTER — Telehealth: Payer: Self-pay | Admitting: *Deleted

## 2012-03-13 ENCOUNTER — Other Ambulatory Visit: Payer: Self-pay | Admitting: Family Medicine

## 2012-03-21 ENCOUNTER — Other Ambulatory Visit: Payer: Self-pay | Admitting: Family Medicine

## 2012-03-30 ENCOUNTER — Other Ambulatory Visit: Payer: Self-pay | Admitting: *Deleted

## 2012-03-30 DIAGNOSIS — E785 Hyperlipidemia, unspecified: Secondary | ICD-10-CM

## 2012-03-30 DIAGNOSIS — E039 Hypothyroidism, unspecified: Secondary | ICD-10-CM

## 2012-04-14 ENCOUNTER — Other Ambulatory Visit: Payer: Self-pay

## 2012-04-14 DIAGNOSIS — Z1231 Encounter for screening mammogram for malignant neoplasm of breast: Secondary | ICD-10-CM

## 2012-04-15 ENCOUNTER — Ambulatory Visit: Payer: BC Managed Care – PPO | Admitting: Family Medicine

## 2012-04-25 LAB — COMPLETE METABOLIC PANEL WITH GFR
Albumin: 4.2 g/dL (ref 3.5–5.2)
BUN: 19 mg/dL (ref 6–23)
Calcium: 9.4 mg/dL (ref 8.4–10.5)
Chloride: 107 mEq/L (ref 96–112)
GFR, Est African American: 86 mL/min
GFR, Est Non African American: 74 mL/min
Glucose, Bld: 93 mg/dL (ref 70–99)
Potassium: 4.4 mEq/L (ref 3.5–5.3)

## 2012-04-25 LAB — LIPID PANEL
Cholesterol: 241 mg/dL — ABNORMAL HIGH (ref 0–200)
Triglycerides: 90 mg/dL (ref <150)

## 2012-04-25 LAB — TSH: TSH: 0.31 u[IU]/mL — ABNORMAL LOW (ref 0.350–4.500)

## 2012-04-26 ENCOUNTER — Other Ambulatory Visit: Payer: Self-pay | Admitting: *Deleted

## 2012-04-26 MED ORDER — SYNTHROID 125 MCG PO TABS: 125.0000 ug | ORAL_TABLET | Freq: Every day | ORAL | Status: DC

## 2012-04-28 ENCOUNTER — Ambulatory Visit (INDEPENDENT_AMBULATORY_CARE_PROVIDER_SITE_OTHER): Payer: BC Managed Care – PPO | Admitting: Family Medicine

## 2012-04-28 ENCOUNTER — Encounter: Payer: Self-pay | Admitting: Family Medicine

## 2012-04-28 VITALS — BP 119/75 | HR 85

## 2012-04-28 DIAGNOSIS — R748 Abnormal levels of other serum enzymes: Secondary | ICD-10-CM

## 2012-04-28 DIAGNOSIS — E039 Hypothyroidism, unspecified: Secondary | ICD-10-CM

## 2012-04-28 DIAGNOSIS — R7401 Elevation of levels of liver transaminase levels: Secondary | ICD-10-CM

## 2012-04-28 DIAGNOSIS — R011 Cardiac murmur, unspecified: Secondary | ICD-10-CM

## 2012-04-28 NOTE — Progress Notes (Signed)
  Subjective:    Patient ID: Kelly Wheeler, female    DOB: Aug 18, 1960, 52 y.o.   MRN: [id number]  HPI Hypothyroid - good energy level. No skin or hair chanes.  She was hyperthyroid on her labs. She has been having more hotflahses than usual over the last 2-3 month.   Eleavted liver enzymes - she has been trying to eat better.  Review of Systems     Objective:   Physical Exam  Constitutional: She is oriented to person, place, and time. She appears well-developed and well-nourished.  HENT:  Head: Normocephalic and atraumatic.  Neck: Neck supple. No thyromegaly present.  Cardiovascular: Normal rate, regular rhythm and normal heart sounds.   Sharp S2 click best heard at the upper right sternal border  Pulmonary/Chest: Effort normal and breath sounds normal.  Lymphadenopathy:    She has no cervical adenopathy.  Neurological: She is alert and oriented to person, place, and time.  Skin: Skin is warm and dry.  Psychiatric: She has a normal mood and affect. Her behavior is normal.          Assessment & Plan:  Hypothyroid - Refilled meds. She is doing well. Due to recheck in 6-8 weeks.  F/u in 6 months.   Elevated liver enzymes- much improved this time. Continye to work on low fat diet and exercise.    Heart murmur - will schedule for echo. She has never had any evaluation of her heart. She says she did have chest pain several years ago and saw her cardiologist. I do think the murmur is benign but would like to get an echo just to better evaluate the valves.

## 2012-04-29 ENCOUNTER — Ambulatory Visit: Payer: BC Managed Care – PPO | Admitting: Family Medicine

## 2012-05-20 LAB — HM PAP SMEAR: HM Pap smear: NEGATIVE

## 2012-05-26 LAB — HPV, HIGH-RISK: HPV DNA High Risk: NEGATIVE

## 2012-05-30 ENCOUNTER — Other Ambulatory Visit: Payer: Self-pay | Admitting: Family Medicine

## 2012-07-08 ENCOUNTER — Other Ambulatory Visit: Payer: Self-pay | Admitting: Family Medicine

## 2012-08-31 ENCOUNTER — Other Ambulatory Visit: Payer: Self-pay | Admitting: Family Medicine

## 2012-10-03 ENCOUNTER — Other Ambulatory Visit: Payer: Self-pay | Admitting: Family Medicine

## 2012-10-06 ENCOUNTER — Other Ambulatory Visit: Payer: Self-pay | Admitting: Family Medicine

## 2012-11-14 ENCOUNTER — Other Ambulatory Visit: Payer: Self-pay | Admitting: Family Medicine

## 2012-11-16 ENCOUNTER — Other Ambulatory Visit: Payer: Self-pay | Admitting: Family Medicine

## 2012-11-17 ENCOUNTER — Other Ambulatory Visit: Payer: Self-pay | Admitting: *Deleted

## 2012-11-17 MED ORDER — SYNTHROID 125 MCG PO TABS: ORAL_TABLET | ORAL | Status: DC

## 2012-11-21 ENCOUNTER — Ambulatory Visit: Payer: BC Managed Care – PPO | Admitting: Physician Assistant

## 2012-11-23 ENCOUNTER — Ambulatory Visit (INDEPENDENT_AMBULATORY_CARE_PROVIDER_SITE_OTHER): Payer: BC Managed Care – PPO | Admitting: Physician Assistant

## 2012-11-23 ENCOUNTER — Encounter: Payer: Self-pay | Admitting: Physician Assistant

## 2012-11-23 VITALS — BP 123/77 | HR 80 | Wt 172.0 lb

## 2012-11-23 DIAGNOSIS — E039 Hypothyroidism, unspecified: Secondary | ICD-10-CM

## 2012-11-23 DIAGNOSIS — R61 Generalized hyperhidrosis: Secondary | ICD-10-CM

## 2012-11-23 LAB — TSH: TSH: 4.329 u[IU]/mL (ref 0.350–4.500)

## 2012-11-23 NOTE — Progress Notes (Signed)
  Subjective:    Patient ID: Kelly Wheeler, female    DOB: [birth year], 52 y.o.   MRN: [id number]  HPI Patient presents to the clinic to follow up on hypothyroidism. Pt is taking 1/2 tablet one day and then full tablet rest of the days. Her TSH was low at last visit. No heat or cold intolerances. Pt is having more frequent nights sweats. No hot flashes. Pt's energy level is fine.      Review of Systems     Objective:   Physical Exam  Constitutional: She is oriented to person, place, and time. She appears well-developed and well-nourished.  HENT:  Head: Normocephalic and atraumatic.  Neck: Normal range of motion. Neck supple. No thyromegaly present.  Cardiovascular: Normal rate, regular rhythm and normal heart sounds.   Pulmonary/Chest: Effort normal and breath sounds normal.  Neurological: She is alert and oriented to person, place, and time.  Skin: Skin is warm and dry.  Psychiatric: She has a normal mood and affect. Her behavior is normal.          Assessment & Plan:  Hypothyroidism- will recheck TSH and free T4. Will make changes and refills accordingly.   Pt declined colonoscopy. Will discuss with Dr. Linford Arnold at CPE in march. Per pt she had mammogram done last year about this time. Will contact breast imaging to get yearly mammogram.

## 2012-11-23 NOTE — Patient Instructions (Signed)
Menopause and Herbal Products Menopause is the normal time of life when menstrual periods stop completely. Menopause is complete when you have missed 12 consecutive menstrual periods. It usually occurs between the ages of 48 to 55, with an average age of 51. Very rarely does a woman develop menopause before 52 years old. At menopause, your ovaries stop producing the female hormones, estrogen and progesterone. This can cause undesirable symptoms and also affect your health. Sometimes the symptoms can occur 4 to 5 years before the menopause begins. There is no relationship between menopause and:  Oral contraceptives.  Number of children you had.  Race.  The age your menstrual periods started (menarche). Heavy smokers and very thin women may develop menopause earlier in life. Estrogen and progesterone hormone treatment is the usual method of treating menopausal symptoms. However, there are women who should not take hormone treatment. This is true of:   Women that have breast or uterine cancer.  Women who prefer not to take hormones because of certain side effects (abnormal uterine bleeding).  Women who are afraid that hormones may cause breast cancer.  Women who have a history of liver disease, heart disease, stroke, or blood clots. For these women, there are other medications that may help treat their menopausal symptoms. These medications are found in plants and botanical products. They can be found in the form of herbs, teas, oils, tinctures, and pills.  CAUSES:  The ovaries stop producing the female hormones estrogen and progesterone.  Other causes include:  Surgery to remove both ovaries.  The ovaries stop functioning for no know reason.  Tumors of the pituitary gland in the brain.  Medical disease that affects the ovaries and hormone production.  Radiation treatment to the abdomen or pelvis.  Chemotherapy that affects the ovaries. PHYTOESTROGENS: Phytoestrogens occur  naturally in plants and plant products. They act like estrogen in the body. Herbal medications are made from these plants and botanical steroids. There are 3 types of phytoestrogens:  Isoflavones (genistein and daidzein) are found in soy, garbanzo beans, miso and tofu foods.  Ligins are found in the shell of seeds. They are used to make oils like flaxseed oil. The bacteria in your intestine act on these foods to produce the estrogen-like hormones.  Coumestans are estrogen-like. Some of the foods they are found in include sunflower seeds and bean sprouts. CONDITIONS AND THEIR POSSIBLE HERBAL TREATMENT:  Hot flashes and night sweats.  Soy, black cohosh and evening primrose.  Irritability, insomnia, depression and memory problems.  Chasteberry, ginseng, and soy.  St. John's wort may be helpful for depression. However, there is a concern of it causing cataracts of the eye and may have bad effects on other medications. St. John's wort should not be taken for long time and without your caregiver's advice.  Loss of libido and vaginal and skin dryness.  Wild yam and soy.  Prevention of coronary heart disease and osteoporosis.  Soy and Isoflavones. Several studies have shown that some women benefit from herbal medications, but most of the studies have not consistently shown that these supplements are much better than placebo. Other forms of treatment to help women with menopausal symptoms include a balanced diet, rest, exercise, vitamin and calcium (with vitamin D) supplements, acupuncture, and group therapy when necessary. THOSE WHO SHOULD NOT TAKE HERBAL MEDICATIONS INCLUDE:  Women who are planning on getting pregnant unless told by your caregiver.  Women who are breastfeeding unless told by your caregiver.  Women who are taking other   prescription medications unless told by your caregiver.  Infants, children, and elderly women unless told by your caregiver. Different herbal medications  have different and unmeasured amounts of the herbal ingredients. There are no regulations, quality control, and standardization of the ingredients in herbal medications. Therefore, the amount of the ingredient in the medication may vary from one herb, pill, tea, oil or tincture to another. Many herbal medications can cause serious problems and can even have poisonous effects if taken too much or too long. If problems develop, the medication should be stopped and recorded by your caregiver. HOME CARE INSTRUCTIONS  Do not take or give children herbal medications without your caregiver's advice.  Let your caregiver know all the medications you are taking. This includes prescription, over-the-counter, eye drops, and creams.  Do not take herbal medications longer or more than recommended.  Tell your caregiver about any side effects from the medication. SEEK MEDICAL CARE IF:  You develop a fever of 102 F (38.9 C), or as directed by your caregiver.  You feel sick to your stomach (nauseous), vomit, or have diarrhea.  You develop a rash.  You develop abdominal pain.  You develop severe headaches.  You start to have vision problems.  You feel dizzy or faint.  You start to feel numbness in any part of your body.  You start shaking (have convulsions). Document Released: 07/01/2007 Document Revised: 12/30/2011 Document Reviewed: 01/28/2010 [employer] Patient Information 2014 [employer].  

## 2012-11-24 ENCOUNTER — Other Ambulatory Visit: Payer: Self-pay | Admitting: *Deleted

## 2012-11-24 MED ORDER — SYNTHROID 125 MCG PO TABS: ORAL_TABLET | ORAL | Status: DC

## 2013-03-09 ENCOUNTER — Other Ambulatory Visit: Payer: Self-pay | Admitting: Physician Assistant

## 2013-06-26 ENCOUNTER — Other Ambulatory Visit: Payer: Self-pay

## 2013-06-26 DIAGNOSIS — Z1231 Encounter for screening mammogram for malignant neoplasm of breast: Secondary | ICD-10-CM

## 2013-07-10 ENCOUNTER — Other Ambulatory Visit: Payer: Self-pay | Admitting: Family Medicine

## 2013-08-10 ENCOUNTER — Ambulatory Visit: Payer: BC Managed Care – PPO

## 2013-08-23 ENCOUNTER — Other Ambulatory Visit: Payer: Self-pay | Admitting: Family Medicine

## 2013-09-11 ENCOUNTER — Ambulatory Visit
Admission: RE | Admit: 2013-09-11 | Discharge: 2013-09-11 | Disposition: A | Discharge status: Home / Self Care | Payer: BC Managed Care – PPO | Source: Ambulatory Visit

## 2013-09-11 ENCOUNTER — Ambulatory Visit (INDEPENDENT_AMBULATORY_CARE_PROVIDER_SITE_OTHER): Payer: BC Managed Care – PPO | Admitting: Physician Assistant

## 2013-09-11 ENCOUNTER — Encounter: Payer: Self-pay | Admitting: Physician Assistant

## 2013-09-11 VITALS — BP 122/79 | HR 80 | Ht 64.0 in | Wt 173.0 lb

## 2013-09-11 DIAGNOSIS — Z1231 Encounter for screening mammogram for malignant neoplasm of breast: Secondary | ICD-10-CM

## 2013-09-11 DIAGNOSIS — E039 Hypothyroidism, unspecified: Secondary | ICD-10-CM | POA: Diagnosis not present

## 2013-09-11 DIAGNOSIS — D229 Melanocytic nevi, unspecified: Secondary | ICD-10-CM

## 2013-09-11 DIAGNOSIS — Z1239 Encounter for other screening for malignant neoplasm of breast: Secondary | ICD-10-CM

## 2013-09-11 DIAGNOSIS — N951 Menopausal and female climacteric states: Secondary | ICD-10-CM | POA: Diagnosis not present

## 2013-09-11 DIAGNOSIS — F4323 Adjustment disorder with mixed anxiety and depressed mood: Secondary | ICD-10-CM

## 2013-09-11 DIAGNOSIS — E785 Hyperlipidemia, unspecified: Secondary | ICD-10-CM | POA: Diagnosis not present

## 2013-09-11 DIAGNOSIS — D239 Other benign neoplasm of skin, unspecified: Secondary | ICD-10-CM

## 2013-09-11 DIAGNOSIS — Z131 Encounter for screening for diabetes mellitus: Secondary | ICD-10-CM | POA: Diagnosis not present

## 2013-09-11 LAB — LIPID PANEL
CHOLESTEROL: 259 mg/dL — AB (ref 0–200)
HDL: 85 mg/dL (ref >39)
LDL Cholesterol: 157 mg/dL — ABNORMAL HIGH (ref 0–99)
TRIGLYCERIDES: 84 mg/dL (ref <150)
Total CHOL/HDL Ratio: 3 Ratio
VLDL: 17 mg/dL (ref 0–40)

## 2013-09-11 LAB — COMPLETE METABOLIC PANEL WITH GFR
ALT: 33 U/L (ref 0–35)
AST: 27 U/L (ref 0–37)
Albumin: 4.1 g/dL (ref 3.5–5.2)
Alkaline Phosphatase: 90 U/L (ref 39–117)
BILIRUBIN TOTAL: 0.7 mg/dL (ref 0.2–1.2)
BUN: 13 mg/dL (ref 6–23)
CALCIUM: 8.7 mg/dL (ref 8.4–10.5)
CHLORIDE: 106 meq/L (ref 96–112)
CO2: 28 meq/L (ref 19–32)
CREATININE: 0.8 mg/dL (ref 0.50–1.10)
GFR, EST NON AFRICAN AMERICAN: 84 mL/min
GLUCOSE: 93 mg/dL (ref 70–99)
Potassium: 4.3 mEq/L (ref 3.5–5.3)
Sodium: 139 mEq/L (ref 135–145)
TOTAL PROTEIN: 6.3 g/dL (ref 6.0–8.3)

## 2013-09-11 NOTE — Patient Instructions (Signed)
Menopause and Herbal Products Menopause is the normal time of life when menstrual periods stop completely. Menopause is complete when you have missed 12 consecutive menstrual periods. It usually occurs between the ages of 48 to 55, with an average age of 51. Very rarely does a woman develop menopause before 53 years old. At menopause, your ovaries stop producing the female hormones, estrogen and progesterone. This can cause undesirable symptoms and also affect your health. Sometimes the symptoms can occur 4 to 5 years before the menopause begins. There is no relationship between menopause and:  Oral contraceptives.  Number of children you had.  Race.  The age your menstrual periods started (menarche). Heavy smokers and very thin women may develop menopause earlier in life. Estrogen and progesterone hormone treatment is the usual method of treating menopausal symptoms. However, there are women who should not take hormone treatment. This is true of:   Women that have breast or uterine cancer.  Women who prefer not to take hormones because of certain side effects (abnormal uterine bleeding).  Women who are afraid that hormones may cause breast cancer.  Women who have a history of liver disease, heart disease, stroke, or blood clots. For these women, there are other medications that may help treat their menopausal symptoms. These medications are found in plants and botanical products. They can be found in the form of herbs, teas, oils, tinctures, and pills.  CAUSES:  The ovaries stop producing the female hormones estrogen and progesterone.  Other causes include:  Surgery to remove both ovaries.  The ovaries stop functioning for no know reason.  Tumors of the pituitary gland in the brain.  Medical disease that affects the ovaries and hormone production.  Radiation treatment to the abdomen or pelvis.  Chemotherapy that affects the ovaries. PHYTOESTROGENS: Phytoestrogens occur  naturally in plants and plant products. They act like estrogen in the body. Herbal medications are made from these plants and botanical steroids. There are 3 types of phytoestrogens:  Isoflavones (genistein and daidzein) are found in soy, garbanzo beans, miso and tofu foods.  Ligins are found in the shell of seeds. They are used to make oils like flaxseed oil. The bacteria in your intestine act on these foods to produce the estrogen-like hormones.  Coumestans are estrogen-like. Some of the foods they are found in include sunflower seeds and bean sprouts. CONDITIONS AND THEIR POSSIBLE HERBAL TREATMENT:  Hot flashes and night sweats.  Soy, black cohosh and evening primrose.  Irritability, insomnia, depression and memory problems.  Chasteberry, ginseng, and soy.  St. John's wort may be helpful for depression. However, there is a concern of it causing cataracts of the eye and may have bad effects on other medications. St. John's wort should not be taken for long time and without your caregiver's advice.  Loss of libido and vaginal and skin dryness.  Wild yam and soy.  Prevention of coronary heart disease and osteoporosis.  Soy and Isoflavones. Several studies have shown that some women benefit from herbal medications, but most of the studies have not consistently shown that these supplements are much better than placebo. Other forms of treatment to help women with menopausal symptoms include a balanced diet, rest, exercise, vitamin and calcium (with vitamin D) supplements, acupuncture, and group therapy when necessary. THOSE WHO SHOULD NOT TAKE HERBAL MEDICATIONS INCLUDE:  Women who are planning on getting pregnant unless told by your caregiver.  Women who are breastfeeding unless told by your caregiver.  Women who are taking other   prescription medications unless told by your caregiver.  Infants, children, and elderly women unless told by your caregiver. Different herbal medications  have different and unmeasured amounts of the herbal ingredients. There are no regulations, quality control, and standardization of the ingredients in herbal medications. Therefore, the amount of the ingredient in the medication may vary from one herb, pill, tea, oil or tincture to another. Many herbal medications can cause serious problems and can even have poisonous effects if taken too much or too long. If problems develop, the medication should be stopped and recorded by your caregiver. HOME CARE INSTRUCTIONS  Do not take or give children herbal medications without your caregiver's advice.  Let your caregiver know all the medications you are taking. This includes prescription, over-the-counter, eye drops, and creams.  Do not take herbal medications longer or more than recommended.  Tell your caregiver about any side effects from the medication. SEEK MEDICAL CARE IF:  You develop a fever of 102 F (38.9 C), or as directed by your caregiver.  You feel sick to your stomach (nauseous), vomit, or have diarrhea.  You develop a rash.  You develop abdominal pain.  You develop severe headaches.  You start to have vision problems.  You feel dizzy or faint.  You start to feel numbness in any part of your body.  You start shaking (have convulsions). Document Released: 07/01/2007 Document Revised: 12/30/2011 Document Reviewed: 01/28/2010 [employer] Patient Information 2015 [employer]. This information is not intended to replace advice given to you by your health care provider. Make sure you discuss any questions you have with your health care provider.  

## 2013-09-12 DIAGNOSIS — N951 Menopausal and female climacteric states: Secondary | ICD-10-CM | POA: Insufficient documentation

## 2013-09-12 LAB — TSH: TSH: 1.671 u[IU]/mL (ref 0.350–4.500)

## 2013-09-12 LAB — T4, FREE: FREE T4: 1.34 ng/dL (ref 0.80–1.80)

## 2013-09-12 NOTE — Progress Notes (Signed)
   Subjective:    Patient ID: Kelly Wheeler, female    DOB: 07-06-1960, 53 y.o.   MRN: [id number]  HPI  Pt presents to the clinic to get medication refill.   Hypothyroidism- taking synthroid daily. Needs recheck. She has noticed her hot flashes and mood changes have been bad this last year. LMP 2012. She has never taken anything for it. She wants to know if could be thyroid out of whack.   Pt has had a hard summer. Her 41 year old son died unexpectantly with pneumonia. She is overwhelmed with grief. She is finding purpose in her daughter and grand-daughter.   Pt also has a mole on right nose that she would like removed. Not causing any problems. Does not seem to be growing.   Review of Systems  All other systems reviewed and are negative.      Objective:   Physical Exam  Constitutional: She is oriented to person, place, and time. She appears well-developed and well-nourished.  HENT:  Head: Normocephalic and atraumatic.    Neck: Normal range of motion. Neck supple. No thyromegaly present.  Cardiovascular: Normal rate, regular rhythm and normal heart sounds.   Pulmonary/Chest: Effort normal and breath sounds normal.  Lymphadenopathy:    She has no cervical adenopathy.  Neurological: She is alert and oriented to person, place, and time.  Skin: Skin is dry.  Psychiatric: She has a normal mood and affect. Her behavior is normal.          Assessment & Plan:  hypothyroidism will recheck labs and adjust accordingly.   Menopausal symptoms- discussed HRT. Pt has not tried herbal products at this time. Gave HO. She would like to try first. Also mentioned effexor for hot flashes and mood. Pt will consider. Pt will follow up with PCP.   Acute stress- pt declines any medication help. She feels like she is grieving like she should.   nonpigmented nevus- will refer to dermatology. Appears to be a uncomplicated non-pigmented nevus.  Since on nose will let derm remove.   Will check  screening labs for pt. Follow up in 2 months.   Mammogram ordered.   Pt declined colonoscopy today.   Discussed needs to schedule CPE/PAP.

## 2013-09-15 ENCOUNTER — Ambulatory Visit: Payer: BC Managed Care – PPO | Admitting: Physician Assistant

## 2013-12-01 ENCOUNTER — Other Ambulatory Visit: Payer: Self-pay | Admitting: Family Medicine

## 2014-01-02 ENCOUNTER — Other Ambulatory Visit: Payer: Self-pay | Admitting: Family Medicine

## 2014-02-08 ENCOUNTER — Other Ambulatory Visit: Payer: Self-pay | Admitting: Physician Assistant

## 2014-03-13 ENCOUNTER — Other Ambulatory Visit: Payer: Self-pay | Admitting: Family Medicine

## 2014-03-13 NOTE — Telephone Encounter (Signed)
After reviewing refill request for Synthroid, left message for patient that we cannot give more refills until she calls and makes an appointment.

## 2014-03-14 MED ORDER — SYNTHROID 125 MCG PO TABS: ORAL_TABLET | ORAL | Status: DC

## 2014-03-14 NOTE — Addendum Note (Signed)
Addended by: Beatrice Lecher D on: 03/14/2014 08:19 AM   Modules accepted: Orders

## 2014-03-15 ENCOUNTER — Telehealth: Payer: Self-pay | Admitting: Family Medicine

## 2014-03-15 NOTE — Telephone Encounter (Signed)
Dr.Metheney, Ms. Cappelletti would to change Providers. She would like to start seeing Jade.

## 2014-03-19 ENCOUNTER — Telehealth: Payer: Self-pay | Admitting: *Deleted

## 2014-03-19 DIAGNOSIS — E039 Hypothyroidism, unspecified: Secondary | ICD-10-CM

## 2014-03-19 DIAGNOSIS — Z0000 Encounter for general adult medical examination without abnormal findings: Secondary | ICD-10-CM

## 2014-03-19 NOTE — Telephone Encounter (Signed)
Labs ordered for upcoming CPE. 

## 2014-03-24 LAB — LIPID PANEL
CHOL/HDL RATIO: 2.9 ratio
CHOLESTEROL: 242 mg/dL — AB (ref 0–200)
HDL: 83 mg/dL (ref >=46)
LDL CALC: 138 mg/dL — AB (ref 0–99)
Triglycerides: 104 mg/dL (ref <150)
VLDL: 21 mg/dL (ref 0–40)

## 2014-03-24 LAB — COMPLETE METABOLIC PANEL WITH GFR
ALBUMIN: 4.1 g/dL (ref 3.5–5.2)
ALT: 55 U/L — ABNORMAL HIGH (ref 0–35)
AST: 41 U/L — AB (ref 0–37)
Alkaline Phosphatase: 102 U/L (ref 39–117)
BILIRUBIN TOTAL: 0.5 mg/dL (ref 0.2–1.2)
BUN: 13 mg/dL (ref 6–23)
CALCIUM: 8.8 mg/dL (ref 8.4–10.5)
CHLORIDE: 107 meq/L (ref 96–112)
CO2: 26 mEq/L (ref 19–32)
Creat: 0.8 mg/dL (ref 0.50–1.10)
GFR, Est African American: >89 mL/min
GFR, Est Non African American: 84 mL/min
Glucose, Bld: 93 mg/dL (ref 70–99)
POTASSIUM: 4.5 meq/L (ref 3.5–5.3)
SODIUM: 141 meq/L (ref 135–145)
Total Protein: 6.7 g/dL (ref 6.0–8.3)

## 2014-03-24 LAB — T4, FREE: Free T4: 1.2 ng/dL (ref 0.80–1.80)

## 2014-03-24 LAB — T3, FREE: T3, Free: 2.6 pg/mL (ref 2.3–4.2)

## 2014-03-24 LAB — TSH: TSH: 4.232 u[IU]/mL (ref 0.350–4.500)

## 2014-03-26 ENCOUNTER — Ambulatory Visit: Payer: Self-pay | Admitting: Physician Assistant

## 2014-03-27 NOTE — Telephone Encounter (Signed)
Ok with me to change

## 2014-03-27 NOTE — Telephone Encounter (Signed)
Ok she had CPe scheduled yesterday but canceled.

## 2014-03-28 ENCOUNTER — Ambulatory Visit (INDEPENDENT_AMBULATORY_CARE_PROVIDER_SITE_OTHER): Payer: BLUE CROSS/BLUE SHIELD | Admitting: Physician Assistant

## 2014-03-28 ENCOUNTER — Encounter: Payer: Self-pay | Admitting: Physician Assistant

## 2014-03-28 VITALS — BP 123/78 | HR 87 | Ht 64.0 in | Wt 180.0 lb

## 2014-03-28 DIAGNOSIS — R748 Abnormal levels of other serum enzymes: Secondary | ICD-10-CM | POA: Diagnosis not present

## 2014-03-28 DIAGNOSIS — E039 Hypothyroidism, unspecified: Secondary | ICD-10-CM | POA: Diagnosis not present

## 2014-03-28 DIAGNOSIS — E785 Hyperlipidemia, unspecified: Secondary | ICD-10-CM | POA: Diagnosis not present

## 2014-03-28 MED ORDER — SYNTHROID 125 MCG PO TABS: ORAL_TABLET | ORAL | Status: DC

## 2014-03-28 NOTE — Progress Notes (Signed)
   Subjective:    Patient ID: Kelly Wheeler, female    DOB: 10/18/1960, 54 y.o.   MRN: [id number]  HPI Patient is a 54 year old female who presents to the clinic to go over labs and get medication refills.  Hypothyroidism-she denies any overt changes or symptoms. She does feel more hungry lately.   Review of Systems  All other systems reviewed and are negative.      Objective:   Physical Exam  Constitutional: She is oriented to person, place, and time. She appears well-developed and well-nourished.  HENT:  Head: Normocephalic and atraumatic.  Cardiovascular: Normal rate, regular rhythm and normal heart sounds.   Pulmonary/Chest: Effort normal and breath sounds normal. She has no wheezes.  Neurological: She is alert and oriented to person, place, and time.  Skin: Skin is dry.  Psychiatric: She has a normal mood and affect. Her behavior is normal.          Assessment & Plan:  Hypothyroidism- discussed the patient with symptomatic we could increase her Synthroid slightly since she has found almost 3 points. She is still in normal range. We opted to keep the same dose. Refilled for 6 month period and will recheck.  Hyperlipidemia-LDL slightly elevated however her cardiac risk is still 1%. She has rate HDL which is protective. Continue to consider low-fat diet and regular exercise.  Elevated liver enzymes-she has no history of elevated liver enzymes. We'll recheck in 2 weeks. Discussed to stay away from Tylenol and alcohol.  Patient is aware that she does need a complete physical to go over health maintenance items that need to be updated.  She needs Tdap will wait for CPE to get this done.

## 2014-04-16 ENCOUNTER — Other Ambulatory Visit: Payer: Self-pay | Admitting: Family Medicine

## 2014-06-21 ENCOUNTER — Other Ambulatory Visit: Payer: Self-pay

## 2014-06-21 DIAGNOSIS — Z1231 Encounter for screening mammogram for malignant neoplasm of breast: Secondary | ICD-10-CM

## 2014-09-13 ENCOUNTER — Ambulatory Visit
Admission: RE | Admit: 2014-09-13 | Discharge: 2014-09-13 | Disposition: A | Discharge status: Home / Self Care | Payer: BLUE CROSS/BLUE SHIELD | Source: Ambulatory Visit

## 2014-09-13 DIAGNOSIS — Z1231 Encounter for screening mammogram for malignant neoplasm of breast: Secondary | ICD-10-CM

## 2014-11-13 ENCOUNTER — Other Ambulatory Visit: Payer: Self-pay | Admitting: Physician Assistant

## 2014-12-21 ENCOUNTER — Other Ambulatory Visit: Payer: Self-pay | Admitting: Physician Assistant

## 2015-01-20 ENCOUNTER — Other Ambulatory Visit: Payer: Self-pay | Admitting: Physician Assistant

## 2015-02-12 ENCOUNTER — Other Ambulatory Visit: Payer: Self-pay

## 2015-02-12 MED ORDER — SYNTHROID 125 MCG PO TABS: 125.0000 ug | ORAL_TABLET | Freq: Every day | ORAL | Status: DC

## 2015-02-18 ENCOUNTER — Encounter: Payer: Self-pay | Admitting: Physician Assistant

## 2015-02-18 ENCOUNTER — Ambulatory Visit (INDEPENDENT_AMBULATORY_CARE_PROVIDER_SITE_OTHER): Payer: BLUE CROSS/BLUE SHIELD | Admitting: Physician Assistant

## 2015-02-18 VITALS — BP 124/73 | HR 76 | Ht 64.0 in | Wt 180.0 lb

## 2015-02-18 DIAGNOSIS — Z79899 Other long term (current) drug therapy: Secondary | ICD-10-CM

## 2015-02-18 DIAGNOSIS — E039 Hypothyroidism, unspecified: Secondary | ICD-10-CM | POA: Diagnosis not present

## 2015-02-18 DIAGNOSIS — Z23 Encounter for immunization: Secondary | ICD-10-CM

## 2015-02-18 DIAGNOSIS — Z131 Encounter for screening for diabetes mellitus: Secondary | ICD-10-CM

## 2015-02-18 DIAGNOSIS — E785 Hyperlipidemia, unspecified: Secondary | ICD-10-CM | POA: Diagnosis not present

## 2015-02-18 LAB — LIPID PANEL
CHOLESTEROL: 226 mg/dL — AB (ref 125–200)
HDL: 81 mg/dL (ref >=46)
LDL Cholesterol: 119 mg/dL (ref <130)
Total CHOL/HDL Ratio: 2.8 Ratio (ref <=5.0)
Triglycerides: 131 mg/dL (ref <150)
VLDL: 26 mg/dL (ref <30)

## 2015-02-18 LAB — COMPLETE METABOLIC PANEL WITH GFR
ALBUMIN: 4.1 g/dL (ref 3.6–5.1)
ALT: 39 U/L — AB (ref 6–29)
AST: 29 U/L (ref 10–35)
Alkaline Phosphatase: 97 U/L (ref 33–130)
BUN: 16 mg/dL (ref 7–25)
CALCIUM: 9.1 mg/dL (ref 8.6–10.4)
CHLORIDE: 106 mmol/L (ref 98–110)
CO2: 26 mmol/L (ref 20–31)
CREATININE: 0.79 mg/dL (ref 0.50–1.05)
GFR, Est African American: >89 mL/min (ref >=60)
GFR, Est Non African American: 85 mL/min (ref >=60)
GLUCOSE: 103 mg/dL — AB (ref 65–99)
Potassium: 4.1 mmol/L (ref 3.5–5.3)
SODIUM: 142 mmol/L (ref 135–146)
Total Bilirubin: 0.5 mg/dL (ref 0.2–1.2)
Total Protein: 6.3 g/dL (ref 6.1–8.1)

## 2015-02-18 LAB — TSH: TSH: 1.3 u[IU]/mL (ref 0.350–4.500)

## 2015-02-18 MED ORDER — TETANUS-DIPHTH-ACELL PERTUSSIS 5-2.5-18.5 LF-MCG/0.5 IM SUSP
0.5000 mL | Freq: Once | INTRAMUSCULAR | Status: AC
  Administered 2015-02-18: 0.5 mL via INTRAMUSCULAR

## 2015-02-18 NOTE — Progress Notes (Signed)
   Subjective:    Patient ID: KEYAHNA HARTEN, female    DOB: Jan 05, 1961, 55 y.o.   MRN: [id number]  HPI Pt is a 55 yo female who presents to the clinic for follow up on hypothyroidism.   Hypothyroidism- on synthyroid 164mcg daily. No concerns or complaints.   Elevated LDL- not on medications. Needs recheck.    Review of Systems  All other systems reviewed and are negative.      Objective:   Physical Exam  Constitutional: She is oriented to person, place, and time. She appears well-developed and well-nourished.  HENT:  Head: Normocephalic and atraumatic.  Right Ear: External ear normal.  Left Ear: External ear normal.  Nose: Nose normal.  Mouth/Throat: Oropharynx is clear and moist. No oropharyngeal exudate.  Eyes: Conjunctivae are normal.  Neck: Normal range of motion. Neck supple.  Cardiovascular: Normal rate, regular rhythm and normal heart sounds.   Pulmonary/Chest: Effort normal and breath sounds normal.  Lymphadenopathy:    She has no cervical adenopathy.  Neurological: She is alert and oriented to person, place, and time.  Psychiatric: She has a normal mood and affect. Her behavior is normal.          Assessment & Plan:  Hypothyroidism- will recheck TSH today and adjust accordingly.   hyperlipidemia will recheck today.   Lipid and cmp ordered today.   Tdap given.  Pt declined colonoscopy. Will get cologuard sent to her house.

## 2015-02-19 ENCOUNTER — Encounter: Payer: Self-pay | Admitting: Physician Assistant

## 2015-02-19 DIAGNOSIS — R7301 Impaired fasting glucose: Secondary | ICD-10-CM | POA: Insufficient documentation

## 2015-02-20 ENCOUNTER — Encounter: Payer: Self-pay | Admitting: Physician Assistant

## 2015-02-21 ENCOUNTER — Telehealth: Payer: Self-pay | Admitting: Physician Assistant

## 2015-02-21 NOTE — Telephone Encounter (Signed)
Called and left message for Kelly Wheeler to come in for an alc nurse visit .Marland Kitchen Thanks

## 2015-02-28 ENCOUNTER — Ambulatory Visit: Payer: BLUE CROSS/BLUE SHIELD

## 2015-02-28 ENCOUNTER — Encounter: Payer: Self-pay | Admitting: Physician Assistant

## 2015-03-04 ENCOUNTER — Other Ambulatory Visit: Payer: Self-pay | Admitting: *Deleted

## 2015-03-04 ENCOUNTER — Other Ambulatory Visit: Payer: Self-pay | Admitting: Physician Assistant

## 2015-03-04 MED ORDER — SYNTHROID 125 MCG PO TABS: 125.0000 ug | ORAL_TABLET | Freq: Every day | ORAL | Status: DC

## 2015-03-15 ENCOUNTER — Telehealth: Payer: Self-pay

## 2015-03-15 ENCOUNTER — Ambulatory Visit (INDEPENDENT_AMBULATORY_CARE_PROVIDER_SITE_OTHER): Payer: BLUE CROSS/BLUE SHIELD | Admitting: Physician Assistant

## 2015-03-15 VITALS — BP 127/79 | HR 70

## 2015-03-15 DIAGNOSIS — R7301 Impaired fasting glucose: Secondary | ICD-10-CM | POA: Diagnosis not present

## 2015-03-15 LAB — POCT GLYCOSYLATED HEMOGLOBIN (HGB A1C): Hemoglobin A1C: 5.6

## 2015-03-15 NOTE — Progress Notes (Signed)
   Subjective:    Patient ID: Kelly Wheeler, female    DOB: 01/12/1961, 55 y.o.   MRN: [id number]  HPI  Pt in for hgbA1c.  Had impaired fastin glucose on labs previously.    Review of Systems     Objective:   Physical Exam        Assessment & Plan:  Tolerated procedure well and without complications.  A1c is 5.6  Pt will be called to discuss normal value but close to pre-diabetic range. Discussed low sugar/carb diet with exercise. Follow up in 1 year with recheck. Iran Planas PA-C

## 2015-03-15 NOTE — Telephone Encounter (Signed)
Notified patient that A1c is 5.6 and Jade Breeback said to limit the sugar and carbs as she is in the pre-diabetic state.  Exercising will also help.

## 2015-04-11 ENCOUNTER — Other Ambulatory Visit: Payer: Self-pay | Admitting: *Deleted

## 2015-04-11 MED ORDER — SYNTHROID 125 MCG PO TABS: 125.0000 ug | ORAL_TABLET | Freq: Every day | ORAL | Status: DC

## 2015-06-14 ENCOUNTER — Other Ambulatory Visit: Payer: Self-pay | Admitting: Physician Assistant

## 2015-07-18 ENCOUNTER — Other Ambulatory Visit: Payer: Self-pay | Admitting: Physician Assistant

## 2015-12-17 ENCOUNTER — Encounter: Payer: Self-pay | Admitting: Sports Medicine

## 2015-12-17 ENCOUNTER — Ambulatory Visit (INDEPENDENT_AMBULATORY_CARE_PROVIDER_SITE_OTHER): Payer: BLUE CROSS/BLUE SHIELD | Admitting: Sports Medicine

## 2015-12-17 ENCOUNTER — Ambulatory Visit (INDEPENDENT_AMBULATORY_CARE_PROVIDER_SITE_OTHER): Payer: BLUE CROSS/BLUE SHIELD

## 2015-12-17 DIAGNOSIS — S8992XA Unspecified injury of left lower leg, initial encounter: Secondary | ICD-10-CM | POA: Diagnosis not present

## 2015-12-17 DIAGNOSIS — S83512A Sprain of anterior cruciate ligament of left knee, initial encounter: Secondary | ICD-10-CM | POA: Insufficient documentation

## 2015-12-17 DIAGNOSIS — X501XXA Overexertion from prolonged static or awkward postures, initial encounter: Secondary | ICD-10-CM

## 2015-12-17 NOTE — Progress Notes (Signed)
   Subjective:    I'm seeing this patient as a consultation for: Iran Planas, PA-C  CC: Left knee injury  HPI: This is a pleasant 55 year old female, yesterday she fell backwards onto her left leg, causing her needed be hyperflexed. She had immediate pain, swelling, she was able to bear weight. The swelling and pain has worsened over the night and she is here for further evaluation and definitive treatment, pain is localized at the medial joint line, moderate, persistent without radiation.  Past medical history:  Negative.  See flowsheet/record as well for more information.  Surgical history: Negative.  See flowsheet/record as well for more information.  Family history: Negative.  See flowsheet/record as well for more information.  Social history: Negative.  See flowsheet/record as well for more information.  Allergies, and medications have been entered into the medical record, reviewed, and no changes needed.   Review of Systems: No headache, visual changes, nausea, vomiting, diarrhea, constipation, dizziness, abdominal pain, skin rash, fevers, chills, night sweats, weight loss, swollen lymph nodes, body aches, joint swelling, muscle aches, chest pain, shortness of breath, mood changes, visual or auditory hallucinations.   Objective:   General: Well Developed, well nourished, and in no acute distress.  Neuro/Psych: Alert and oriented x3, extra-ocular muscles intact, able to move all 4 extremities, sensation grossly intact. Skin: Warm and dry, no rashes noted.  Respiratory: Not using accessory muscles, speaking in full sentences, trachea midline.  Cardiovascular: Pulses palpable, no extremity edema. Abdomen: Does not appear distended. Left Knee: Visible swollen with palpable fluid, tender to palpation at the medial joint line. Range of motion limited from 5 to 40 of flexion. Some pain with the application of valgus stress, but unable to fully appreciate the ligaments due to  guarding. Non painful patellar compression. Patellar and quadriceps tendons unremarkable. Hamstring and quadriceps strength is normal.  Procedure: Real-time Ultrasound Guided aspiration/Injection of left knee Device: GE Logiq E  Verbal informed consent obtained.  Time-out conducted.  Noted no overlying erythema, induration, or other signs of local infection.  Skin prepped in a sterile fashion.  Local anesthesia: Topical Ethyl chloride.  With sterile technique and under real time ultrasound guidance:  Aspirated approximately 25 mL gross hemarthrosis, syringe switched and 1 mL kenalog 40, 2 mL lidocaine, 2 mL Marcaine injected easily Completed without difficulty  Pain immediately resolved suggesting accurate placement of the medication.  Advised to call if fevers/chills, erythema, induration, drainage, or persistent bleeding.  Images permanently stored and available for review in the ultrasound unit.  Impression: Technically successful ultrasound guided injection.  Impression and Recommendations:   This case required medical decision making of moderate complexity.  Left knee injury Suspect meniscal tear, aspiration and injection, x-rays, strap with compressive dressing. Return to see me in 2 weeks, MRI if no better, she does have significant loss of range of motion which is worrisome for a bucket-handle meniscal tear.  Aspiration of gross hemarthrosis, I am going to go ahead and order an MRI.

## 2015-12-17 NOTE — Assessment & Plan Note (Addendum)
Suspect meniscal tear, aspiration and injection, x-rays, strap with compressive dressing. Return to see me in 2 weeks, MRI if no better, she does have significant loss of range of motion which is worrisome for a bucket-handle meniscal tear.  Aspiration of gross hemarthrosis, I am going to go ahead and order an MRI.

## 2015-12-23 ENCOUNTER — Ambulatory Visit (INDEPENDENT_AMBULATORY_CARE_PROVIDER_SITE_OTHER): Payer: BLUE CROSS/BLUE SHIELD

## 2015-12-23 DIAGNOSIS — S83512A Sprain of anterior cruciate ligament of left knee, initial encounter: Secondary | ICD-10-CM

## 2015-12-23 DIAGNOSIS — M7122 Synovial cyst of popliteal space [Baker], left knee: Secondary | ICD-10-CM | POA: Diagnosis not present

## 2015-12-23 DIAGNOSIS — M25462 Effusion, left knee: Secondary | ICD-10-CM | POA: Diagnosis not present

## 2015-12-23 DIAGNOSIS — S8992XA Unspecified injury of left lower leg, initial encounter: Secondary | ICD-10-CM

## 2015-12-23 DIAGNOSIS — W19XXXA Unspecified fall, initial encounter: Secondary | ICD-10-CM

## 2015-12-25 ENCOUNTER — Encounter: Payer: Self-pay | Admitting: Sports Medicine

## 2015-12-25 ENCOUNTER — Ambulatory Visit (INDEPENDENT_AMBULATORY_CARE_PROVIDER_SITE_OTHER): Payer: BLUE CROSS/BLUE SHIELD | Admitting: Sports Medicine

## 2015-12-25 DIAGNOSIS — S83512A Sprain of anterior cruciate ligament of left knee, initial encounter: Secondary | ICD-10-CM

## 2015-12-25 NOTE — Progress Notes (Signed)
  Subjective:    CC: Follow-up  HPI: This is a pleasant 55 year old female, she returns for MRI results, she fell backwards over her knee hyperflexing it and adding valgus stress, she had immediate pain, swelling. We aspirated gross hemarthrosis, and a subsequent MRI showed a complete anterior cruciate ligament tear.  Past medical history:  Negative.  See flowsheet/record as well for more information.  Surgical history: Negative.  See flowsheet/record as well for more information.  Family history: Negative.  See flowsheet/record as well for more information.  Social history: Negative.  See flowsheet/record as well for more information.  Allergies, and medications have been entered into the medical record, reviewed, and no changes needed.   Review of Systems: No fevers, chills, night sweats, weight loss, chest pain, or shortness of breath.   Objective:    General: Well Developed, well nourished, and in no acute distress.  Neuro: Alert and oriented x3, extra-ocular muscles intact, sensation grossly intact.  HEENT: Normocephalic, atraumatic, pupils equal round reactive to light, neck supple, no masses, no lymphadenopathy, thyroid nonpalpable.  Skin: Warm and dry, no rashes. Cardiac: Regular rate and rhythm, no murmurs rubs or gallops, no lower extremity edema.  Respiratory: Clear to auscultation bilaterally. Not using accessory muscles, speaking in full sentences. Left Knee: Normal to inspection with no erythema or effusion or obvious bony abnormalities. Palpation normal with no warmth or joint line tenderness or patellar tenderness or condyle tenderness. ROM normal in flexion and extension and lower leg rotation. Unstable endpoints for the LCL and the anterior cruciate ligament Negative Mcmurray's and provocative meniscal tests. Non painful patellar compression. Patellar and quadriceps tendons unremarkable. Hamstring and quadriceps strength is normal.  MRI reviewed and shows a complete  tear of the anterior cruciate ligament, menisci are fine. There is the typical pivot shift bone bruising but no osteochondral defects.  Impression and Recommendations:    Anterior cruciate ligament complete tear, left, initial encounter Aspiration of hemarthrosis led to an MRI which showed a complete anterior cruciate ligament tear. We did inject the knee, she is currently pain-free. Only minimal instability. Patient does not sleep, and is agreeable to proceed with aggressive physical therapy before considering any reconstructive surgery. Return to see me in 6 weeks.

## 2015-12-25 NOTE — Assessment & Plan Note (Signed)
Aspiration of hemarthrosis led to an MRI which showed a complete anterior cruciate ligament tear. We did inject the knee, she is currently pain-free. Only minimal instability. Patient does not sleep, and is agreeable to proceed with aggressive physical therapy before considering any reconstructive surgery. Return to see me in 6 weeks.

## 2016-01-01 ENCOUNTER — Ambulatory Visit (INDEPENDENT_AMBULATORY_CARE_PROVIDER_SITE_OTHER): Payer: BLUE CROSS/BLUE SHIELD | Admitting: Physical Therapy

## 2016-01-01 ENCOUNTER — Encounter: Payer: Self-pay | Admitting: Physical Therapy

## 2016-01-01 DIAGNOSIS — M25662 Stiffness of left knee, not elsewhere classified: Secondary | ICD-10-CM

## 2016-01-01 DIAGNOSIS — M6281 Muscle weakness (generalized): Secondary | ICD-10-CM | POA: Diagnosis not present

## 2016-01-01 DIAGNOSIS — R52 Pain, unspecified: Secondary | ICD-10-CM

## 2016-01-01 DIAGNOSIS — R600 Localized edema: Secondary | ICD-10-CM | POA: Diagnosis not present

## 2016-01-01 NOTE — Therapy (Signed)
[place], [place], [address] Phone: [phone]   Fax:  [phone]  Physical Therapy Evaluation  Patient Details  Name: Kelly Wheeler MRN: [id number] Date of Birth: [birth year] Referring Provider: Dr. Dianah Field  Encounter Date: 01/01/2016      PT End of Session - 01/01/16 0856    Visit Number 1   Number of Visits 12   Date for PT Re-Evaluation 02/12/16   PT Start Time 0802   PT Stop Time 0845   PT Time Calculation (min) 43 min   Activity Tolerance Patient tolerated treatment well   Behavior During Therapy [facility] for tasks assessed/performed      History reviewed. No pertinent past medical history.  Past Surgical History:  Procedure Laterality Date  . cyst removed under Right breast  1-11  . TUBAL LIGATION      There were no vitals filed for this visit.       Subjective Assessment - 01/01/16 0807    Subjective Pt reports that on 12/16/15 her grandson was pulling on the back on her shirt and she lost her balance and fell backward as her legs slid out from under her. Initially she didn't have any pain but by the end of the day she was having tremendous pain in the Lt knee. Went to see Dr. Dianah Field and was found to have an anterior cruciate ligament complete tear of the left knee.    How long can you sit comfortably? unlimited   How long can you stand comfortably? unlimited   How long can you walk comfortably? 60 minutes   Diagnostic tests MRI    Patient Stated Goals Be able to play with her grandson   Currently in Pain? Yes   Pain Score 1    Pain Location Knee   Pain Orientation Left   Pain Descriptors / Indicators Aching   Pain Type Acute pain   Pain Onset 1 to 4 weeks ago   Pain Frequency Intermittent   Aggravating Factors  going down stair   Pain Relieving Factors rest/elevation            [facility] PT Assessment - 01/01/16 0001      Assessment   Medical Diagnosis Left  Anterior cruciate ligament complete tear   Referring Provider Dr. Dianah Field   Onset Date/Surgical Date 12/16/15   Next MD Visit 02/06/16   Prior Therapy none     Balance Screen   Has the patient fallen in the past 6 months Yes   How many times? 1   Has the patient had a decrease in activity level because of a fear of falling?  Yes   Is the patient reluctant to leave their home because of a fear of falling?  Yes     Prior Function   Vocation Part time employment   Vocation Requirements works Engineer, petroleum for physician office   Leisure play with grandson, walking     Observation/Other Assessments   Focus on Therapeutic Outcomes (FOTO)  50% limitation     Sensation   Light Touch Appears Intact   Additional Comments area of ecchymosis at lateral knee (area of injection), minimal effusion.      Posture/Postural Control   Posture Comments no significant deviations     AROM   Left Knee Extension 0   Left Knee Flexion 116     Strength   Left Hip ABduction 4+/5   Right/Left Knee Left   Left Knee Flexion  4+/5   Left Knee Extension 4/5     Palpation   Patella mobility no restrictions noted   Palpation comment no pain at medial/lateral joint lines     Special Tests    Special Tests Knee Special Tests   Knee Special tests  other     other    Findings Positive   Side  Left   Comments lachman     Ambulation/Gait   Gait Comments uncompensated gait pattern                   [facility] Adult PT Treatment/Exercise - 01/01/16 0001      Knee/Hip Exercises: Supine   Quad Sets Strengthening;Left;1 set;10 reps   Heel Slides AROM;Left;1 set;10 reps   Bridges Strengthening;Both;1 set;10 reps   Straight Leg Raises Strengthening;Left;1 set;10 reps   Other Supine Knee/Hip Exercises heel raises bilateral 1X10                PT Education - 01/01/16 0848    Education provided Yes   Education Details HEP   [name]   Methods  Explanation;Demonstration;Tactile cues;Verbal cues;Handout   Comprehension Verbalized understanding;Returned demonstration             PT Long Term Goals - 01/01/16 0904      PT LONG TERM GOAL #1   Title Pt to be independent with HEP for ROM and strengthening. (02/12/16)   Time 6   Period Weeks   Status New     PT LONG TERM GOAL #2   Title Pt to ambulate up/down stairs without pain. (02/12/16)   Time 6   Period Weeks   Status New     PT LONG TERM GOAL #3   Title Pt to report ability to play with her grandson without restrictions or knee instability. (02/12/16)   Time 6   Period Weeks   Status New     PT LONG TERM GOAL #4   Title FOTO score </= 31%. (02/12/16)   Time 6   Period Weeks   Status New     PT LONG TERM GOAL #5   Title Pt to rate her pain as 0/10 during light activity. (02/12/16)   Time 6   Period Weeks   Status New               Plan - 01/01/16 0857    Clinical Impression Statement Pt is a 55 y.o. female who presents to OPPT services with Lt knee ACL complete tear which reportedly happened when she fell backward on 12/16/15. She states that she has minimal pain at this time and felt much better after getting her knee drained and receiving an injection by Dr Dianah Field. At this time she does demonstrate decreased strength through the LLE along with decreased ROM. Functionally she admits that she has been compensating and having some difficulty with stairs and playing with her grandson. Her ultimate goal is to be able to be active and continue with her walking program, playing with her grandson, and going up/down stairs. The pt is appropriate for OPPT services to attempt a conservative treatement of her ACL tear. This was discussed with the pt and she agrees to PT services.    Rehab Potential Good   PT Frequency 1x / week   PT Duration 6 weeks   PT Treatment/Interventions ADLs/Self Care Home Management;Cryotherapy;Electrical Stimulation;Iontophoresis  4mg /ml Dexamethasone;Ultrasound;Gait training;Stair training;Functional mobility training;Therapeutic activities;Therapeutic exercise;Neuromuscular re-education;Patient/family education;Vasopneumatic Device;Taping;Dry needling;Manual techniques   PT Next Visit  Plan Progress HEP as tolerated, consider closed chair exercises. Add hip abduction.    Consulted and Agree with Plan of Care Patient      Patient will benefit from skilled therapeutic intervention in order to improve the following deficits and impairments:  Decreased activity tolerance, Decreased strength, Impaired flexibility, Pain, Decreased range of motion, Difficulty walking, Increased edema  Visit Diagnosis: Muscle weakness (generalized) - Plan: PT plan of care cert/re-cert  Stiffness of left knee, not elsewhere classified - Plan: PT plan of care cert/re-cert  Acute pain - Plan: PT plan of care cert/re-cert  Localized edema - Plan: PT plan of care cert/re-cert     Problem List Patient Active Problem List   Diagnosis Date Noted  . Anterior cruciate ligament complete tear, left, initial encounter 12/17/2015  . Elevated fasting glucose 02/19/2015  . Hyperlipidemia 03/28/2014  . Elevated liver enzymes 03/28/2014  . Menopausal symptoms 09/12/2013  . Hypothyroidism 12/09/2009  . COSTOCHONDRITIS, LEFT 04/16/2009    Linard Millers, PT, CSCS 01/01/2016, 9:11 AM  [facility] [place] [address] [place], [place], [address] Phone: [phone]   Fax:  [phone]  Name: Kelly Wheeler MRN: [id number] Date of Birth: [birth year]

## 2016-01-03 ENCOUNTER — Ambulatory Visit (INDEPENDENT_AMBULATORY_CARE_PROVIDER_SITE_OTHER): Payer: BLUE CROSS/BLUE SHIELD | Admitting: Sports Medicine

## 2016-01-03 ENCOUNTER — Telehealth: Payer: Self-pay

## 2016-01-03 ENCOUNTER — Encounter: Payer: Self-pay | Admitting: Sports Medicine

## 2016-01-03 DIAGNOSIS — S83512A Sprain of anterior cruciate ligament of left knee, initial encounter: Secondary | ICD-10-CM

## 2016-01-03 NOTE — Telephone Encounter (Signed)
Pt coming in at 2:30

## 2016-01-03 NOTE — Telephone Encounter (Signed)
Have her come in for a hinged knee brace.

## 2016-01-03 NOTE — Assessment & Plan Note (Signed)
Unfortunate reinjury, mild effusion and lateral joint line pain. Aspirated and injected with low-dose triamcinolone. She was given a hinged knee brace. She has only had a single session of physical therapy. She will continue with aggressive physical therapy for 6 weeks before considering anterior cruciate ligament reconstruction.

## 2016-01-03 NOTE — Progress Notes (Signed)
  Subjective:    CC: Reinjury  HPI: This is a pleasant 55 year old female, she recently to her anterior cruciate ligament after a fall, she did well after injection, and has been starting aggressive physical therapy, we decided not to proceed with anterior cruciate ligament reconstruction, recently she tripped and had another injury to her knee, worse at the lateral joint line with a recurrence of swelling. Pain is mild, persistent.  Past medical history:  Negative.  See flowsheet/record as well for more information.  Surgical history: Negative.  See flowsheet/record as well for more information.  Family history: Negative.  See flowsheet/record as well for more information.  Social history: Negative.  See flowsheet/record as well for more information.  Allergies, and medications have been entered into the medical record, reviewed, and no changes needed.   Review of Systems: No fevers, chills, night sweats, weight loss, chest pain, or shortness of breath.   Objective:    General: Well Developed, well nourished, and in no acute distress.  Neuro: Alert and oriented x3, extra-ocular muscles intact, sensation grossly intact.  HEENT: Normocephalic, atraumatic, pupils equal round reactive to light, neck supple, no masses, no lymphadenopathy, thyroid nonpalpable.  Skin: Warm and dry, no rashes. Cardiac: Regular rate and rhythm, no murmurs rubs or gallops, no lower extremity edema.  Respiratory: Clear to auscultation bilaterally. Not using accessory muscles, speaking in full sentences. Left Knee: Minimal he swollen with tenderness at the lateral joint line. ROM normal in flexion and extension and lower leg rotation. Positive Lachman's sign Negative Mcmurray's and provocative meniscal tests. Non painful patellar compression. Patellar and quadriceps tendons unremarkable. Hamstring and quadriceps strength is normal.  Procedure: Real-time Ultrasound Guided aspiration/injection of left  knee Device: GE Logiq E  Verbal informed consent obtained.  Time-out conducted.  Noted no overlying erythema, induration, or other signs of local infection.  Skin prepped in a sterile fashion.  Local anesthesia: Topical Ethyl chloride.  With sterile technique and under real time ultrasound guidance:  8 cc of serosanguineous fluid aspirated, syringe switched and 1/2 mL Kenalog 40, 2 mL lidocaine, 2 mL Marcaine injected easily Completed without difficulty  Pain immediately resolved suggesting accurate placement of the medication.  Advised to call if fevers/chills, erythema, induration, drainage, or persistent bleeding.  Images permanently stored and available for review in the ultrasound unit.  Impression: Technically successful ultrasound guided injection.  Impression and Recommendations:    Anterior cruciate ligament complete tear, left, initial encounter Unfortunate reinjury, mild effusion and lateral joint line pain. Aspirated and injected with low-dose triamcinolone. She was given a hinged knee brace. She has only had a single session of physical therapy. She will continue with aggressive physical therapy for 6 weeks before considering anterior cruciate ligament reconstruction.

## 2016-01-03 NOTE — Telephone Encounter (Signed)
Pt called stating she fell again yesterday because her knee gave way and wants to know what she should do now. Please assist

## 2016-01-10 ENCOUNTER — Encounter: Payer: Self-pay | Admitting: Rehabilitative and Restorative Service Providers"

## 2016-01-10 ENCOUNTER — Ambulatory Visit (INDEPENDENT_AMBULATORY_CARE_PROVIDER_SITE_OTHER): Payer: BLUE CROSS/BLUE SHIELD | Admitting: Rehabilitative and Restorative Service Providers"

## 2016-01-10 DIAGNOSIS — R52 Pain, unspecified: Secondary | ICD-10-CM | POA: Diagnosis not present

## 2016-01-10 DIAGNOSIS — M6281 Muscle weakness (generalized): Secondary | ICD-10-CM

## 2016-01-10 DIAGNOSIS — R600 Localized edema: Secondary | ICD-10-CM | POA: Diagnosis not present

## 2016-01-10 DIAGNOSIS — M25662 Stiffness of left knee, not elsewhere classified: Secondary | ICD-10-CM | POA: Diagnosis not present

## 2016-01-10 NOTE — Patient Instructions (Addendum)
Straight Leg Raise: With External Leg Rotation    Lie on back with right leg straight, opposite leg bent. Rotate straight leg out and lift _10-12___ inches. Repeat _10___ times per set. Do _1-3___ sets per session. Do __1__ sessions per day.   Strengthening: Hip Abduction (Side-Lying)    Tighten muscles on front of left thigh, then lift leg _10-12___ inches from surface, keeping knee locked. Lead up with the heel not toes  Repeat __10__ times per set. Do __1-3__ sets per session. Do _1___ sessions per day.   Standing at counter  One foot forward; one back  - shift weight forward and then back Slowly 30 times each foot forward   (Clinic) Squat: (Assist)    Standing at sink, squat by dropping hips back as if sitting in a chair. Repeat _10___ times per set. Do __1-3__ sets per session. Do _1-2___   POSITION: Single Leg Balance   Stand on left leg. Hold _30-60__ seconds. _3-5__ reps __2_ times per day.

## 2016-01-10 NOTE — Therapy (Signed)
[place], [place], [address] Phone: [phone]   Fax:  [phone]  Physical Therapy Treatment  Patient Details  Name: Kelly Wheeler MRN: [id number] Date of Birth: Apr 05, 1960 Referring Provider: Dr Dianah Field  Encounter Date: 01/10/2016      PT End of Session - 01/10/16 1455    Visit Number 2   Number of Visits 12   Date for PT Re-Evaluation 02/12/16   PT Start Time [id number]   PT Stop Time 1543   PT Time Calculation (min) 58 min   Activity Tolerance Patient tolerated treatment well      History reviewed. No pertinent past medical history.  Past Surgical History:  Procedure Laterality Date  . cyst removed under Right breast  1-11  . TUBAL LIGATION      There were no vitals filed for this visit.      Subjective Assessment - 01/10/16 1457    Subjective Patient reports that she fell 01/02/16 when getting into her car when her knee "gave way" and she fell injuring her knee. She was seen by Dr T the following day. He aspirated and injected the knee and placed her in a hinged knee brace.    Currently in Pain? No/denies            [facility] PT Assessment - 01/10/16 0001      Assessment   Medical Diagnosis Left Anterior cruciate ligament complete tear   Referring Provider Dr Dianah Field   Onset Date/Surgical Date 12/16/15   Next MD Visit 02/06/16   Prior Therapy none     Balance Screen   Has the patient fallen in the past 6 months Yes   How many times? 2  fell 01/02/16   Has the patient had a decrease in activity level because of a fear of falling?  Yes   Is the patient reluctant to leave their home because of a fear of falling?  Yes     AROM   Left Knee Extension 0   Left Knee Flexion 116     Strength   Left Hip ABduction 4+/5   Left Knee Flexion 4+/5   Left Knee Extension 4/5     Ambulation/Gait   Gait Comments decreased stance phase Lt LE with shortened stride with Rt LE                       [facility] Adult PT Treatment/Exercise - 01/10/16 0001      Knee/Hip Exercises: Stretches   Passive Hamstring Stretch 3 reps;30 seconds     Knee/Hip Exercises: Aerobic   Stationary Bike nustep L5 x 10 min      Knee/Hip Exercises: Standing   Heel Raises Both;20 reps   Functional Squat 10 reps  standing at counter dropping hips down UE support for safety   SLS 30 sec hold x 3 reps Lt/Rt    Other Standing Knee Exercises mid stride stance wt shift fwd/back for neuromuscular re-ed x 30 each foot forward      Knee/Hip Exercises: Supine   Quad Sets Strengthening;Left;1 set;10 reps  10 sec    Straight Leg Raises Strengthening;Left;1 set;10 reps  5 sec hold    Straight Leg Raise with External Rotation Strengthening;Left;1 set;10 reps  5 sec hold slow eccentric     Knee/Hip Exercises: Sidelying   Hip ABduction Strengthening;Left;1 set;10 reps  leading with heel      Cryotherapy   Number Minutes Cryotherapy 12  Minutes   Cryotherapy Location Knee  Lt   Type of Cryotherapy Ice pack                PT Education - 01/10/16 1530    Education Details HEP    [name]   Methods Explanation;Demonstration;Tactile cues;Verbal cues;Handout   Comprehension Verbalized understanding;Returned demonstration;Verbal cues required;Tactile cues required             PT Long Term Goals - 01/10/16 1457      PT LONG TERM GOAL #1   Title Pt to be independent with HEP for ROM and strengthening. (02/12/16)   Time 6   Period Weeks   Status On-going     PT LONG TERM GOAL #2   Title Pt to ambulate up/down stairs without pain. (02/12/16)   Time 6   Period Weeks   Status On-going     PT LONG TERM GOAL #3   Title Pt to report ability to play with her grandson without restrictions or knee instability. (02/12/16)   Time 6   Period Weeks   Status On-going     PT LONG TERM GOAL #4   Title FOTO score </= 31%. (02/12/16)   Time 6   Period Weeks    Status On-going     PT LONG TERM GOAL #5   Title Pt to rate her pain as 0/10 during light activity. (02/12/16)   Time 6   Period Weeks   Status On-going               Plan - 01/10/16 1502    Clinical Impression Statement Patient presents with report of re-injury to Lt knee last week. She is interested in avoiding surgery and wants to work on getting her knee stronger. She has not done her exercises since re-injury. Afraid she would hurt her knee.  Tolerated exercise today well; requires verbal and tactile cues for all exercises. No significant changes in Lt LE compared to initial visit. Stressed importance of strengthening to avoid surgery and encouraged patient to work daily on HEP . Pt may benefit from increased frequency in PT. Goals unchanged.    Rehab Potential Good   PT Frequency 1x / week   PT Duration 6 weeks   PT Treatment/Interventions ADLs/Self Care Home Management;Cryotherapy;Electrical Stimulation;Iontophoresis 4mg /ml Dexamethasone;Ultrasound;Gait training;Stair training;Functional mobility training;Therapeutic activities;Therapeutic exercise;Neuromuscular re-education;Patient/family education;Vasopneumatic Device;Taping;Dry needling;Manual techniques   PT Next Visit Plan Progress HEP as tolerated, consider closed chair exercises. progress hip abduction and extensioin strengthening   Consulted and Agree with Plan of Care Patient      Patient will benefit from skilled therapeutic intervention in order to improve the following deficits and impairments:  Decreased activity tolerance, Decreased strength, Impaired flexibility, Pain, Decreased range of motion, Difficulty walking, Increased edema  Visit Diagnosis: Muscle weakness (generalized)  Stiffness of left knee, not elsewhere classified  Acute pain  Localized edema     Problem List Patient Active Problem List   Diagnosis Date Noted  . Anterior cruciate ligament complete tear, left, initial encounter 12/17/2015   . Elevated fasting glucose 02/19/2015  . Hyperlipidemia 03/28/2014  . Elevated liver enzymes 03/28/2014  . Menopausal symptoms 09/12/2013  . Hypothyroidism 12/09/2009  . COSTOCHONDRITIS, LEFT 04/16/2009    Aubreanna Percle Nilda Simmer PT, MPH  01/10/2016, 3:43 PM  [facility] [place], [place], [address] Phone: [phone]   Fax:  [phone]  Name: ZAMONI BIEDENBACH MRN: [id number] Date of Birth: August 20, 1960

## 2016-01-17 ENCOUNTER — Ambulatory Visit (INDEPENDENT_AMBULATORY_CARE_PROVIDER_SITE_OTHER): Payer: BLUE CROSS/BLUE SHIELD | Admitting: Rehabilitative and Restorative Service Providers"

## 2016-01-17 ENCOUNTER — Encounter: Payer: Self-pay | Admitting: Rehabilitative and Restorative Service Providers"

## 2016-01-17 DIAGNOSIS — R52 Pain, unspecified: Secondary | ICD-10-CM | POA: Diagnosis not present

## 2016-01-17 DIAGNOSIS — M6281 Muscle weakness (generalized): Secondary | ICD-10-CM | POA: Diagnosis not present

## 2016-01-17 DIAGNOSIS — M25662 Stiffness of left knee, not elsewhere classified: Secondary | ICD-10-CM

## 2016-01-17 NOTE — Therapy (Signed)
[place], [place], [address] Phone: [phone]   Fax:  [phone]  Physical Therapy Treatment  Patient Details  Name: Kelly Wheeler MRN: [id number] Date of Birth: 1960/09/07 Referring Provider: Dr Dianah Field  Encounter Date: 01/17/2016      PT End of Session - 01/17/16 1502    Visit Number 3   Number of Visits 12   Date for PT Re-Evaluation 02/12/16   PT Start Time 1432   PT Stop Time 1527   PT Time Calculation (min) 55 min   Activity Tolerance Patient tolerated treatment well      History reviewed. No pertinent past medical history.  Past Surgical History:  Procedure Laterality Date  . cyst removed under Right breast  1-11  . TUBAL LIGATION      There were no vitals filed for this visit.      Subjective Assessment - 01/17/16 1503    Subjective Alyssa reports that she is having less pain in the Le knee. She is watching the kind of shoes she wears and doing her exercises at home.    Currently in Pain? No/denies                         [facility] Adult PT Treatment/Exercise - 01/17/16 0001      Knee/Hip Exercises: Standing   Heel Raises Both;20 reps   Hip Abduction Right;Left;2 sets;10 reps  w/ green TB    Hip Extension Right;Left;2 sets;10 reps  green TB    Functional Squat 10 reps  standing at counter dropping hips down UE support for safety   SLS 30 sec hold x 3 reps Lt/Rt   blue foam   Other Standing Knee Exercises mid stride stance wt shift fwd/back for neuromuscular re-ed x 30 each foot forward      Knee/Hip Exercises: Supine   Quad Sets Strengthening;Left;1 set;10 reps  10 sec    Straight Leg Raises Strengthening;Left;10 reps;2 sets  5 sec hold    Straight Leg Raise with External Rotation Strengthening;Left;10 reps;2 sets  5 sec hold slow eccentric   Other Supine Knee/Hip Exercises heel raises bilateral 1X10     Knee/Hip Exercises: Sidelying   Hip  ABduction Strengthening;Left;1 set;10 reps  leading with heel      Cryotherapy   Number Minutes Cryotherapy 12 Minutes   Cryotherapy Location Knee  Lt   Type of Cryotherapy Ice pack                PT Education - 01/17/16 1525    Education provided Yes   Education Details HEP   [name]   Methods Explanation;Demonstration;Tactile cues;Verbal cues;Handout   Comprehension Verbalized understanding;Returned demonstration;Verbal cues required;Tactile cues required             PT Long Term Goals - 01/17/16 1507      PT LONG TERM GOAL #1   Title Pt to be independent with HEP for ROM and strengthening. (02/12/16)   Time 6   Period Weeks   Status On-going     PT LONG TERM GOAL #2   Title Pt to ambulate up/down stairs without pain. (02/12/16)   Time 6   Period Weeks   Status On-going     PT LONG TERM GOAL #3   Title Pt to report ability to play with her grandson without restrictions or knee instability. (02/12/16)   Time 6   Period Weeks  Status On-going     PT LONG TERM GOAL #4   Title FOTO score </= 31%. (02/12/16)   Time 6   Period Weeks   Status On-going     PT LONG TERM GOAL #5   Title Pt to rate her pain as 0/10 during light activity. (02/12/16)   Time 6   Period Weeks   Status On-going               Plan - 01/17/16 1527    Clinical Impression Statement Progressing well with no increase in symptoms following fall. She has modified her shoe wear and is workingon exercises at home. Added exercises today without difficulty or ancrease in pain or discomfort. Progressing well with rehab.    Rehab Potential Good   PT Frequency 1x / week   PT Duration 6 weeks   PT Treatment/Interventions ADLs/Self Care Home Management;Cryotherapy;Electrical Stimulation;Iontophoresis 4mg /ml Dexamethasone;Ultrasound;Gait training;Stair training;Functional mobility training;Therapeutic activities;Therapeutic exercise;Neuromuscular  re-education;Patient/family education;Vasopneumatic Device;Taping;Dry needling;Manual techniques   PT Next Visit Plan Progress HEP as tolerated, closed chair exercises. progress hip abduction and extensioin strengthening   Consulted and Agree with Plan of Care Patient      Patient will benefit from skilled therapeutic intervention in order to improve the following deficits and impairments:  Decreased activity tolerance, Decreased strength, Impaired flexibility, Pain, Decreased range of motion, Difficulty walking, Increased edema  Visit Diagnosis: Muscle weakness (generalized)  Stiffness of left knee, not elsewhere classified  Acute pain     Problem List Patient Active Problem List   Diagnosis Date Noted  . Anterior cruciate ligament complete tear, left, initial encounter 12/17/2015  . Elevated fasting glucose 02/19/2015  . Hyperlipidemia 03/28/2014  . Elevated liver enzymes 03/28/2014  . Menopausal symptoms 09/12/2013  . Hypothyroidism 12/09/2009  . COSTOCHONDRITIS, LEFT 04/16/2009    Kelly Wheeler Kelly Wheeler PT, MPH  01/17/2016, 3:50 PM  [facility] [place], [place], [address] Phone: [phone]   Fax:  [phone]  Name: Kelly Wheeler MRN: [id number] Date of Birth: July 02, 1960

## 2016-01-17 NOTE — Patient Instructions (Addendum)
Strengthening: Hip Abductor - Resisted    With band looped around both legs above knees, push thighs apart. Repeat _10___ times per set. Do __2-3__ sets per session. Do ___1_ sessions per day.  Strengthening: Hip Abduction - Resisted   Lead out with heel  With tubing around right leg, other side toward anchor, extend leg out from side. Repeat __10__ times per set. Do __2-3__ sets per session. Do __1__ sessions per day.  Strengthening: Hip Extension - Resisted    With tubing around right ankle, face anchor and pull leg straight back. Repeat __10__ times per set. Do _2-3___ sets per session. Do __1__ sessions per day.

## 2016-01-30 ENCOUNTER — Ambulatory Visit (INDEPENDENT_AMBULATORY_CARE_PROVIDER_SITE_OTHER): Payer: BLUE CROSS/BLUE SHIELD | Admitting: Rehabilitative and Restorative Service Providers"

## 2016-01-30 ENCOUNTER — Encounter: Payer: Self-pay | Admitting: Rehabilitative and Restorative Service Providers"

## 2016-01-30 DIAGNOSIS — R600 Localized edema: Secondary | ICD-10-CM | POA: Diagnosis not present

## 2016-01-30 DIAGNOSIS — R52 Pain, unspecified: Secondary | ICD-10-CM | POA: Diagnosis not present

## 2016-01-30 DIAGNOSIS — M6281 Muscle weakness (generalized): Secondary | ICD-10-CM

## 2016-01-30 DIAGNOSIS — M25662 Stiffness of left knee, not elsewhere classified: Secondary | ICD-10-CM | POA: Diagnosis not present

## 2016-01-30 NOTE — Therapy (Signed)
[place], [place], [address] Phone: [phone]   Fax:  [phone]  Physical Therapy Treatment  Patient Details  Name: Kelly Wheeler MRN: [id number] Date of Birth: 1960/06/22 Referring Provider: Dr Dianah Field  Encounter Date: 01/30/2016      PT End of Session - 01/30/16 1630    Visit Number 4   Number of Visits 12   Date for PT Re-Evaluation 02/12/16   PT Start Time 1618   PT Stop Time 1709   PT Time Calculation (min) 51 min      History reviewed. No pertinent past medical history.  Past Surgical History:  Procedure Laterality Date  . cyst removed under Right breast  1-11  . TUBAL LIGATION      There were no vitals filed for this visit.          [facility] PT Assessment - 01/30/16 0001      Assessment   Medical Diagnosis Left Anterior cruciate ligament complete tear   Referring Provider Dr Dianah Field   Onset Date/Surgical Date 12/16/15   Next MD Visit 02/06/16   Prior Therapy none     AROM   Left Knee Extension 0   Left Knee Flexion 132     Strength   Left Hip ABduction --  5-/5   Left Knee Flexion --  5-/5   Left Knee Extension 4+/5                     [facility] Adult PT Treatment/Exercise - 01/30/16 0001      Knee/Hip Exercises: Stretches   Passive Hamstring Stretch 3 reps;30 seconds   Other Knee/Hip Stretches gastroc stretch 30 sec x 3      Knee/Hip Exercises: Aerobic   Stationary Bike L4 x 5 min      Knee/Hip Exercises: Standing   Heel Raises Left;20 reps   Forward Lunges Left;Right;20 reps   Hip Abduction Right;Left;2 sets;10 reps  w/ green TB    Hip Extension Right;Left;2 sets;10 reps  green TB    Functional Squat 10 reps;2 sets  standing at counter dropping hips down UE support for safety   Functional Squat Limitations sumo squats in doorway x 20    SLS 30 sec hold x 3 reps Lt/Rt   blue foam   SLS with Vectors SLS Bosu with swing opposite LE  fwd/back x 20 ab/ad x 20    Walking with Sports Cord 5 reps each - fwd/Lt/Rt/backward    Other Standing Knee Exercises step up on Bosu with abduction of opposite LE x 10 each LE    Other Standing Knee Exercises squat holding 10 # weight x 10      Knee/Hip Exercises: Sidelying   Hip ABduction Strengthening;Left;1 set;20 reps  leading with heel hip in some extension recruit glut med                 PT Education - 01/30/16 1716    Education provided Yes   Education Details HEP   [name]   Methods Explanation;Demonstration;Tactile cues;Verbal cues;Handout   Comprehension Verbalized understanding;Returned demonstration;Verbal cues required;Tactile cues required             PT Long Term Goals - 01/30/16 1631      PT LONG TERM GOAL #1   Title Pt to be independent with HEP for ROM and strengthening. (02/12/16)   Time 6   Period Weeks   Status Partially Met  PT LONG TERM GOAL #2   Title Pt to ambulate up/down stairs without pain. (02/12/16)   Time 6   Period Weeks   Status Achieved     PT LONG TERM GOAL #3   Title Pt to report ability to play with her grandson without restrictions or knee instability. (02/12/16)   Time 6   Period Weeks   Status Partially Met     PT LONG TERM GOAL #4   Title FOTO score </= 31%. (02/12/16)   Time 6   Period Weeks   Status On-going     PT LONG TERM GOAL #5   Title Pt to rate her pain as 0/10 during light activity. (02/12/16)   Time 6   Period Weeks   Status Achieved               Plan - 01/30/16 1642    Clinical Impression Statement Progressing well with knee rehab. Gaining strength and stability Lt LE with increased functional activity level. Weaned from knee brace most of the time. Progressing well toward stated goals of therapy.    Rehab Potential Good   PT Frequency 1x / week   PT Duration 6 weeks   PT Treatment/Interventions ADLs/Self Care Home Management;Cryotherapy;Electrical  Stimulation;Iontophoresis 79m/ml Dexamethasone;Ultrasound;Gait training;Stair training;Functional mobility training;Therapeutic activities;Therapeutic exercise;Neuromuscular re-education;Patient/family education;Vasopneumatic Device;Taping;Dry needling;Manual techniques   PT Next Visit Plan Progress HEP as tolerated, closed chair exercises. progress hip abduction strengthening   Consulted and Agree with Plan of Care Patient      Patient will benefit from skilled therapeutic intervention in order to improve the following deficits and impairments:  Decreased activity tolerance, Decreased strength, Impaired flexibility, Pain, Decreased range of motion, Difficulty walking, Increased edema  Visit Diagnosis: Muscle weakness (generalized)  Stiffness of left knee, not elsewhere classified  Acute pain  Localized edema     Problem List Patient Active Problem List   Diagnosis Date Noted  . Anterior cruciate ligament complete tear, left, initial encounter 12/17/2015  . Elevated fasting glucose 02/19/2015  . Hyperlipidemia 03/28/2014  . Elevated liver enzymes 03/28/2014  . Menopausal symptoms 09/12/2013  . Hypothyroidism 12/09/2009  . COSTOCHONDRITIS, LEFT 04/16/2009    Drenda Sobecki PNilda SimmerPT, MPH  01/30/2016, 5:31 PM  [facility] [address]Phone: [phone]  Fax:  [phone] Name: Kelly CLIBURNMRN: [id number] of Birth: [birth year]

## 2016-01-30 NOTE — Patient Instructions (Addendum)
Balance: Unilateral - Forward Lean    Stand on left foot, hands on hips. Keeping hips level, bend forward as if to touch forehead to wall. Hold _5-10 ___ seconds. Relax. Repeat __10__ times per set. Do __2-3__ sets per session. Do __1-2__ sessions per day. Touch forward to lower surface    Balance: Unilateral - Foam    Eyes open, balance with right leg on dense foam. Hold __30-60__ seconds. Repeat __3-5__ times per set. Do __2-3 __ sessions per day. Perform exercise with eyes closed.   Standing on step step down forward lowering yourself slowly forward the push yourself back up onto the step 20 reps   Anterior    Stand with equal weight on both feet. Lunge with right leg along A direction and return _10__ times. _2-3__ reps _1-2__ times per day.  Lift onto left toes while balancing on Left foot only 10-20 reps

## 2016-02-06 ENCOUNTER — Ambulatory Visit (INDEPENDENT_AMBULATORY_CARE_PROVIDER_SITE_OTHER): Payer: BLUE CROSS/BLUE SHIELD | Admitting: Sports Medicine

## 2016-02-06 ENCOUNTER — Encounter: Payer: BLUE CROSS/BLUE SHIELD | Admitting: Rehabilitative and Restorative Service Providers"

## 2016-02-06 ENCOUNTER — Other Ambulatory Visit: Payer: Self-pay | Admitting: Physician Assistant

## 2016-02-06 ENCOUNTER — Encounter: Payer: Self-pay | Admitting: Sports Medicine

## 2016-02-06 ENCOUNTER — Encounter: Payer: Self-pay | Admitting: Rehabilitative and Restorative Service Providers"

## 2016-02-06 ENCOUNTER — Ambulatory Visit (INDEPENDENT_AMBULATORY_CARE_PROVIDER_SITE_OTHER): Payer: BLUE CROSS/BLUE SHIELD | Admitting: Rehabilitative and Restorative Service Providers"

## 2016-02-06 DIAGNOSIS — M6281 Muscle weakness (generalized): Secondary | ICD-10-CM

## 2016-02-06 DIAGNOSIS — S83512A Sprain of anterior cruciate ligament of left knee, initial encounter: Secondary | ICD-10-CM

## 2016-02-06 DIAGNOSIS — H5213 Myopia, bilateral: Secondary | ICD-10-CM | POA: Diagnosis not present

## 2016-02-06 DIAGNOSIS — R52 Pain, unspecified: Secondary | ICD-10-CM

## 2016-02-06 DIAGNOSIS — R600 Localized edema: Secondary | ICD-10-CM | POA: Diagnosis not present

## 2016-02-06 DIAGNOSIS — M25662 Stiffness of left knee, not elsewhere classified: Secondary | ICD-10-CM

## 2016-02-06 NOTE — Patient Instructions (Addendum)
SIT TO STAND: No Device    Sit with feet shoulder-width apart, on floor. Lean chest forward, raise hips up from surface. Straighten hips and knees. Weight bear equally on left and right sides. __10_ reps per set, _2-3__ sets per day. Place left leg closer to sitting surface and repeat.   Squat Supported    Back straight, bend knees, but do not allow them past toes. Hold 10-20 sec. Repeat 5-10 times Do __2-3__ sets.   Anterior Step-Down    Stand with both feet on _6-8__ inch step. Step down forward with right foot, touching heel to the floor and return _10__ times. _2-3__ sets __1-2_ times per day.

## 2016-02-06 NOTE — Progress Notes (Signed)
  Subjective:    CC:  Follow-up  HPI: This is a pleasant 56 year old female, she is post anterior cruciate ligament complete rupture, overall she's done well with aspiration, injection and aggressive formal physical therapy. Overall she has no pain but still tells me that she has some instability in the knee. She is agreeable to discuss this now with one of our surgical colleagues.  Past medical history:  Negative.  See flowsheet/record as well for more information.  Surgical history: Negative.  See flowsheet/record as well for more information.  Family history: Negative.  See flowsheet/record as well for more information.  Social history: Negative.  See flowsheet/record as well for more information.  Allergies, and medications have been entered into the medical record, reviewed, and no changes needed.   Review of Systems: No fevers, chills, night sweats, weight loss, chest pain, or shortness of breath.   Objective:    General: Well Developed, well nourished, and in no acute distress.  Neuro: Alert and oriented x3, extra-ocular muscles intact, sensation grossly intact.  HEENT: Normocephalic, atraumatic, pupils equal round reactive to light, neck supple, no masses, no lymphadenopathy, thyroid nonpalpable.  Skin: Warm and dry, no rashes. Cardiac: Regular rate and rhythm, no murmurs rubs or gallops, no lower extremity edema.  Respiratory: Clear to auscultation bilaterally. Not using accessory muscles, speaking in full sentences.  Impression and Recommendations:    Anterior cruciate ligament complete tear, left, initial encounter We did our best to treat her nonoperatively. Aspirations, injections, NSAIDs, aggressive formal physical therapy. Knee is still unstable, I'm going to have her discuss this with Dr. Rhona Raider and discuss the pros and cons of anterior cruciate ligament reconstruction.

## 2016-02-06 NOTE — Therapy (Signed)
[place], [place], [address] Phone: [phone]   Fax:  [phone]  Physical Therapy Treatment  Patient Details  Name: Kelly Wheeler MRN: [id number] Date of Birth: 1960-08-12 Referring Provider: Dr Dianah Field  Encounter Date: 02/06/2016      PT End of Session - 02/06/16 1405    Visit Number 5   Number of Visits 12   Date for PT Re-Evaluation 02/12/16   PT Start Time [id number]   PT Stop Time 1455   PT Time Calculation (min) 53 min   Activity Tolerance Patient tolerated treatment well      History reviewed. No pertinent past medical history.  Past Surgical History:  Procedure Laterality Date  . cyst removed under Right breast  1-11  . TUBAL LIGATION      There were no vitals filed for this visit.      Subjective Assessment - 02/06/16 1406    Subjective Kelly Wheeler reports that she saw Dr T today. She does not feel the Lt knee is as stable as the Rt. It is gettig stronger but is just not there yet. Feels "different' from the Rt knee. She especially notices this when she stands and straightens the knees. Lt feels "like it won''t go back all the way"   Currently in Pain? No/denies                         [facility] Adult PT Treatment/Exercise - 02/06/16 0001      Knee/Hip Exercises: Stretches   Passive Hamstring Stretch 3 reps;30 seconds     Knee/Hip Exercises: Standing   Lateral Step Up Left;Right;2 sets;10 reps;Hand Hold: 0;Step Height: 6"   Forward Step Up Left;Hand Hold: 0;Right;2 sets;10 reps;Step Height: 6"   Step Down Left;2 sets;10 reps;Hand Hold: 0;Step Height: 6"   Functional Squat 10 reps;2 sets  standing at counter dropping hips down UE support for safety   Wall Squat 2 sets;5 reps;5 seconds   SLS 30 sec hold x 8 reps Lt/Rt   gray foam   Walking with Sports Cord 5 reps each - fwd/Lt/Rt/backward      Knee/Hip Exercises: Seated   Sit to Sand 10 reps;2 sets;without UE  support  2nd set with Lt LE back behind Rt      Cryotherapy   Number Minutes Cryotherapy 12 Minutes   Cryotherapy Location Knee  Lt   Type of Cryotherapy Ice pack                PT Education - 02/06/16 1447    Education provided Yes   Education Details HEP   [name]   Methods Explanation;Demonstration;Tactile cues;Verbal cues;Handout   Comprehension Verbalized understanding;Returned demonstration;Verbal cues required;Tactile cues required             PT Long Term Goals - 02/06/16 1413      PT LONG TERM GOAL #1   Title Pt to be independent with HEP for ROM and strengthening. (02/12/16)   Time 6   Period Weeks   Status On-going     PT LONG TERM GOAL #2   Title Pt to ambulate up/down stairs without pain. (02/12/16)   Time 6   Period Weeks   Status Achieved     PT LONG TERM GOAL #3   Title Pt to report ability to play with her grandson without restrictions or knee instability. (02/12/16)   Time 6   Period Weeks  Status On-going     PT LONG TERM GOAL #4   Title FOTO score </= 31%. (02/12/16)   Time 6   Period Weeks   Status On-going     PT LONG TERM GOAL #5   Title Pt to rate her pain as 0/10 during light activity. (02/12/16)   Time 6   Period Weeks   Status Achieved               Plan - 02/06/16 1409    Clinical Impression Statement Gradual improvement in strength and stability Lt knee. She is not in the brace at all now. Feels secure with walking and daiily activities but does not feel that the Lt LE is as stable as the Rt. Will consult with orthopedist re- ACL instability/repair.    Rehab Potential Good   PT Frequency 1x / week   PT Duration 6 weeks   PT Treatment/Interventions ADLs/Self Care Home Management;Cryotherapy;Electrical Stimulation;Iontophoresis 4mg /ml Dexamethasone;Ultrasound;Gait training;Stair training;Functional mobility training;Therapeutic activities;Therapeutic exercise;Neuromuscular  re-education;Patient/family education;Vasopneumatic Device;Taping;Dry needling;Manual techniques   PT Next Visit Plan Progress HEP as tolerated, closed chair exercises. progress hip abduction strengthening awaiting MD consult for surgical intervention vs continued consertative treatment.    Consulted and Agree with Plan of Care Patient      Patient will benefit from skilled therapeutic intervention in order to improve the following deficits and impairments:  Decreased activity tolerance, Decreased strength, Impaired flexibility, Pain, Decreased range of motion, Difficulty walking, Increased edema  Visit Diagnosis: Muscle weakness (generalized)  Stiffness of left knee, not elsewhere classified  Acute pain  Localized edema     Problem List Patient Active Problem List   Diagnosis Date Noted  . Anterior cruciate ligament complete tear, left, initial encounter 12/17/2015  . Elevated fasting glucose 02/19/2015  . Hyperlipidemia 03/28/2014  . Elevated liver enzymes 03/28/2014  . Menopausal symptoms 09/12/2013  . Hypothyroidism 12/09/2009  . COSTOCHONDRITIS, LEFT 04/16/2009    Kelly Wheeler PT, MPH  02/06/2016, 2:50 PM  [facility] [place], [place], [address] Phone: [phone]   Fax:  [phone]  Name: Kelly Wheeler MRN: [id number] Date of Birth: 1960-08-16

## 2016-02-06 NOTE — Assessment & Plan Note (Signed)
We did our best to treat her nonoperatively. Aspirations, injections, NSAIDs, aggressive formal physical therapy. Knee is still unstable, I'm going to have her discuss this with Dr. Rhona Raider and discuss the pros and cons of anterior cruciate ligament reconstruction.

## 2016-02-13 ENCOUNTER — Encounter: Payer: BLUE CROSS/BLUE SHIELD | Admitting: Rehabilitative and Restorative Service Providers"

## 2016-02-14 DIAGNOSIS — H5210 Myopia, unspecified eye: Secondary | ICD-10-CM | POA: Diagnosis not present

## 2016-02-17 DIAGNOSIS — M25562 Pain in left knee: Secondary | ICD-10-CM | POA: Diagnosis not present

## 2016-02-20 ENCOUNTER — Encounter: Payer: Self-pay | Admitting: Rehabilitative and Restorative Service Providers"

## 2016-02-20 ENCOUNTER — Ambulatory Visit (INDEPENDENT_AMBULATORY_CARE_PROVIDER_SITE_OTHER): Payer: BLUE CROSS/BLUE SHIELD | Admitting: Rehabilitative and Restorative Service Providers"

## 2016-02-20 DIAGNOSIS — M6281 Muscle weakness (generalized): Secondary | ICD-10-CM

## 2016-02-20 DIAGNOSIS — R52 Pain, unspecified: Secondary | ICD-10-CM

## 2016-02-20 DIAGNOSIS — M25662 Stiffness of left knee, not elsewhere classified: Secondary | ICD-10-CM | POA: Diagnosis not present

## 2016-02-20 NOTE — Therapy (Signed)
[place], [place], [address] Phone: [phone]   Fax:  [phone]  Physical Therapy Treatment  Patient Details  Name: Kelly Wheeler MRN: [id number] Date of Birth: 06/27/1960 Referring Provider: Dr Dianah Field  Encounter Date: 02/20/2016      PT End of Session - 02/20/16 1634    Visit Number 6   Number of Visits 12   Date for PT Re-Evaluation 02/11/16   PT Start Time 1620   PT Stop Time 1700   PT Time Calculation (min) 40 min   Activity Tolerance Patient tolerated treatment well      History reviewed. No pertinent past medical history.  Past Surgical History:  Procedure Laterality Date  . cyst removed under Right breast  1-11  . TUBAL LIGATION      There were no vitals filed for this visit.      Subjective Assessment - 02/20/16 1635    Subjective Patient reports that she was seen by Dr Latanya Maudlin told her the surgery was up to her. If she is OK with where she is does not have to have surgery but if she wants to be more active she will problbly need to have the surgery. Patient reports that she thinks she will have the surgery but does not know when. She may wait until the fall so she is not recovering through the summer.    Currently in Pain? No/denies            [facility] PT Assessment - 02/20/16 0001      Assessment   Medical Diagnosis Left Anterior cruciate ligament complete tear   Referring Provider Dr Dianah Field   Onset Date/Surgical Date 12/16/15   Next MD Visit PRN     Observation/Other Assessments   Focus on Therapeutic Outcomes (FOTO)  43% limitation      AROM   Left Knee Extension 0   Left Knee Flexion 132     Strength   Left Hip ABduction 5/5   Left Knee Flexion 5/5   Left Knee Extension 5/5                     [facility] Adult PT Treatment/Exercise - 02/20/16 0001      Knee/Hip Exercises: Standing   Lateral Step Up Left;Right;2 sets;10 reps;Hand  Hold: 0;Step Height: 6"   Forward Step Up Left;Hand Hold: 0;Right;2 sets;10 reps;Step Height: 6"   Step Down Left;2 sets;10 reps;Hand Hold: 0;Step Height: 6"   Functional Squat 10 reps;2 sets  standing at counter dropping hips down UE support for safety   Wall Squat 2 sets;5 reps;5 seconds   SLS 60 sec hold x 5 reps Lt/Rt   gray foam     Knee/Hip Exercises: Seated   Sit to Sand 10 reps;2 sets;without UE support  2nd set with Lt LE back behind Rt                 PT Education - 02/20/16 1645    Education provided Yes   Education Details reviewed HEP encouraged consistent home program    [name]   Methods Explanation;Demonstration;Tactile cues;Verbal cues;Handout   Comprehension Verbalized understanding             PT Long Term Goals - 02/20/16 1648      PT LONG TERM GOAL #1   Title Pt to be independent with HEP for ROM and strengthening. (02/12/16)   Time 6   Period Weeks  Status Achieved     PT LONG TERM GOAL #2   Time 6   Period Weeks   Status Achieved     PT LONG TERM GOAL #3   Title Pt to report ability to play with her grandson without restrictions or knee instability. (02/12/16)   Time 6   Period Weeks   Status Partially Met     PT LONG TERM GOAL #4   Title FOTO score </= 31%. (02/12/16)   Time 6   Period Weeks   Status Partially Met     PT LONG TERM GOAL #5   Title Pt to rate her pain as 0/10 during light activity. (02/12/16)   Time 6   Period Weeks   Status Achieved               Plan - 02/20/16 1646    Clinical Impression Statement Patient continues to progress with strengthening of Lt knee. She will continue with HEP and decide on surgery to repair Lt ACL.    Rehab Potential Good   PT Frequency 1x / week   PT Duration 6 weeks   PT Treatment/Interventions ADLs/Self Care Home Management;Cryotherapy;Electrical Stimulation;Iontophoresis '4mg'$ /ml Dexamethasone;Ultrasound;Gait training;Stair training;Functional mobility  training;Therapeutic activities;Therapeutic exercise;Neuromuscular re-education;Patient/family education;Vasopneumatic Device;Taping;Dry needling;Manual techniques   PT Next Visit Plan D/C to independent HEP    Consulted and Agree with Plan of Care Patient      Patient will benefit from skilled therapeutic intervention in order to improve the following deficits and impairments:  Decreased activity tolerance, Decreased strength, Impaired flexibility, Pain, Decreased range of motion, Difficulty walking, Increased edema  Visit Diagnosis: Muscle weakness (generalized)  Stiffness of left knee, not elsewhere classified  Acute pain     Problem List Patient Active Problem List   Diagnosis Date Noted  . Anterior cruciate ligament complete tear, left, initial encounter 12/17/2015  . Elevated fasting glucose 02/19/2015  . Hyperlipidemia 03/28/2014  . Elevated liver enzymes 03/28/2014  . Menopausal symptoms 09/12/2013  . Hypothyroidism 12/09/2009  . COSTOCHONDRITIS, LEFT 04/16/2009    Celyn Nilda Simmer PT, MPH  02/20/2016, 6:09 PM  [facility] [place], [place], [address] Phone: [phone]   Fax:  [phone]  Name: Kelly Wheeler MRN: [id number] Date of Birth: 02/05/1960  PHYSICAL THERAPY DISCHARGE SUMMARY  Visits from Start of Care: 6  Current functional level related to goals / functional outcomes: Good gains in strength and ROM. Patient has returned to all normal functional activities. She will decide whether to proceed with ACL repair.   Remaining deficits: ACL instability    Education / Equipment: HEP  Plan: Patient agrees to discharge.  Patient goals were partially met. Patient is being discharged due to meeting the stated rehab goals.  ?????   Celyn P. Helene Kelp PT, MPH 02/20/16 6:10 PM

## 2016-02-20 NOTE — Patient Instructions (Addendum)
Knee Extension Mobilization: Towel Prop    With rolled towel under right ankle, place __2-3__ pound weight across knee. Hold __1-2__ minutes. Repeat _1-2___ times per set. Once a day.   Also prop heel on towel roll and press top of knee down with hands Hold for 20-30 sec  3-5 reps  1 x/day

## 2016-08-30 ENCOUNTER — Other Ambulatory Visit: Payer: Self-pay | Admitting: Family Medicine

## 2016-12-04 DIAGNOSIS — Z124 Encounter for screening for malignant neoplasm of cervix: Secondary | ICD-10-CM | POA: Diagnosis not present

## 2016-12-04 DIAGNOSIS — Z130 Encounter for screening for diseases of the blood and blood-forming organs and certain disorders involving the immune mechanism: Secondary | ICD-10-CM | POA: Diagnosis not present

## 2016-12-04 DIAGNOSIS — Z1389 Encounter for screening for other disorder: Secondary | ICD-10-CM | POA: Diagnosis not present

## 2016-12-04 DIAGNOSIS — Z01419 Encounter for gynecological examination (general) (routine) without abnormal findings: Secondary | ICD-10-CM | POA: Diagnosis not present

## 2016-12-04 DIAGNOSIS — Z1151 Encounter for screening for human papillomavirus (HPV): Secondary | ICD-10-CM | POA: Diagnosis not present

## 2016-12-04 DIAGNOSIS — Z6828 Body mass index (BMI) 28.0-28.9, adult: Secondary | ICD-10-CM | POA: Diagnosis not present

## 2016-12-04 DIAGNOSIS — Z1231 Encounter for screening mammogram for malignant neoplasm of breast: Secondary | ICD-10-CM | POA: Diagnosis not present

## 2016-12-04 LAB — HM MAMMOGRAPHY

## 2016-12-08 LAB — HM PAP SMEAR: HM PAP: NEGATIVE

## 2016-12-12 ENCOUNTER — Other Ambulatory Visit: Payer: Self-pay | Admitting: Physician Assistant

## 2016-12-24 ENCOUNTER — Other Ambulatory Visit: Payer: Self-pay | Admitting: Physician Assistant

## 2017-01-01 ENCOUNTER — Ambulatory Visit (INDEPENDENT_AMBULATORY_CARE_PROVIDER_SITE_OTHER): Payer: BLUE CROSS/BLUE SHIELD | Admitting: Physician Assistant

## 2017-01-01 ENCOUNTER — Encounter: Payer: Self-pay | Admitting: Physician Assistant

## 2017-01-01 VITALS — BP 139/70 | HR 84 | Ht 64.0 in | Wt 163.0 lb

## 2017-01-01 DIAGNOSIS — E039 Hypothyroidism, unspecified: Secondary | ICD-10-CM | POA: Diagnosis not present

## 2017-01-01 DIAGNOSIS — R7301 Impaired fasting glucose: Secondary | ICD-10-CM | POA: Diagnosis not present

## 2017-01-01 DIAGNOSIS — Z131 Encounter for screening for diabetes mellitus: Secondary | ICD-10-CM

## 2017-01-01 DIAGNOSIS — E663 Overweight: Secondary | ICD-10-CM

## 2017-01-01 DIAGNOSIS — Z13220 Encounter for screening for lipoid disorders: Secondary | ICD-10-CM

## 2017-01-01 DIAGNOSIS — E538 Deficiency of other specified B group vitamins: Secondary | ICD-10-CM

## 2017-01-01 DIAGNOSIS — R232 Flushing: Secondary | ICD-10-CM

## 2017-01-01 NOTE — Progress Notes (Signed)
   Subjective:    Patient ID: Kelly Wheeler, female    DOB: 1960-11-28, 56 y.o.   MRN: [id number]  HPI Pt is a 56 yo female who presents to the clinic to follow up on hypothyroidism. She does need refills. She is taking levothyroxine daily. She is concerned the dose is too high as she has lost 17lbs on keto diet and having hot flashes. She denies taking any other medication. She already went through hot flashes associated with menopause. She feels like that has resolved and wonders why it cam back.   She never return cologuard kit.   Pap at [facility] in [place].   .. Active Ambulatory Problems    Diagnosis Date Noted  . Hypothyroidism 12/09/2009  . Menopausal symptoms 09/12/2013  . Hyperlipidemia 03/28/2014  . Elevated liver enzymes 03/28/2014  . Elevated fasting glucose 02/19/2015  . Anterior cruciate ligament complete tear, left, initial encounter 12/17/2015  . Overweight (BMI 25.0-29.9) 01/01/2017  . Hot flashes 01/02/2017   Resolved Ambulatory Problems    Diagnosis Date Noted  . EPIDERMOID CYST 05/09/2009  . COSTOCHONDRITIS, LEFT 04/16/2009  . WEIGHT GAIN 05/09/2009  . CHEST PAIN, LEFT 04/16/2009   No Additional Past Medical History    Review of Systems  All other systems reviewed and are negative.      Objective:   Physical Exam  Constitutional: She is oriented to person, place, and time. She appears well-developed and well-nourished.  HENT:  Head: Normocephalic and atraumatic.  Neck: Normal range of motion. Neck supple. No thyromegaly present.  Cardiovascular: Normal rate, regular rhythm and normal heart sounds.  Pulmonary/Chest: Effort normal and breath sounds normal.  Neurological: She is alert and oriented to person, place, and time.  Psychiatric: She has a normal mood and affect. Her behavior is normal.          Assessment & Plan:  Marland KitchenMarland KitchenChelsei was seen today for hypothyroidism.  Diagnoses and all orders for this visit:  Hypothyroidism, unspecified  type  Hot flashes -     TSH -     CBC with Differential/Platelet -     B12 -     Vitamin D 1,25 dihydroxy  Screening for lipid disorders -     Lipid Panel w/reflex Direct LDL  Screening for diabetes mellitus -     COMPLETE METABOLIC PANEL WITH GFR  Overweight (BMI 25.0-29.9) -     Amb ref to Medical Nutrition Therapy-MNT  Elevated fasting glucose -     Amb ref to Medical Nutrition Therapy-MNT   TSH ordered and will adjust accordingly.   Labs checked to look into hot flashes.  Great job on weight loss. Keep it up.   Discussed insurance might not pay for nutrition referral. She does have elevated fasting glucose which could develop into diabetes. Pt aware she would like to try. Discussed going through a personal trainer to get advice.

## 2017-01-02 ENCOUNTER — Telehealth: Payer: Self-pay | Admitting: Physician Assistant

## 2017-01-02 DIAGNOSIS — E538 Deficiency of other specified B group vitamins: Secondary | ICD-10-CM | POA: Insufficient documentation

## 2017-01-02 DIAGNOSIS — R232 Flushing: Secondary | ICD-10-CM | POA: Insufficient documentation

## 2017-01-02 MED ORDER — LEVOTHYROXINE SODIUM 112 MCG PO TABS: 112.0000 ug | ORAL_TABLET | Freq: Every day | ORAL | 1 refills | Status: DC

## 2017-01-02 NOTE — Telephone Encounter (Signed)
[facility] [place] [facility] pap for most recent pap  Please call and give pt info on cologuard to call insurance

## 2017-01-05 LAB — CBC WITH DIFFERENTIAL/PLATELET
BASOS PCT: 0.4 %
Basophils Absolute: 30 cells/uL (ref 0–200)
EOS ABS: 68 {cells}/uL (ref 15–500)
Eosinophils Relative: 0.9 %
HEMATOCRIT: 39.2 % (ref 35.0–45.0)
Hemoglobin: 13.4 g/dL (ref 11.7–15.5)
LYMPHS ABS: 1410 {cells}/uL (ref 850–3900)
MCH: 30 pg (ref 27.0–33.0)
MCHC: 34.2 g/dL (ref 32.0–36.0)
MCV: 87.9 fL (ref 80.0–100.0)
MPV: 11.3 fL (ref 7.5–12.5)
Monocytes Relative: 7.1 %
Neutro Abs: 5460 cells/uL (ref 1500–7800)
Neutrophils Relative %: 72.8 %
PLATELETS: 174 10*3/uL (ref 140–400)
RBC: 4.46 10*6/uL (ref 3.80–5.10)
RDW: 12.8 % (ref 11.0–15.0)
TOTAL LYMPHOCYTE: 18.8 %
WBC: 7.5 10*3/uL (ref 3.8–10.8)
WBCMIX: 533 {cells}/uL (ref 200–950)

## 2017-01-05 LAB — VITAMIN D 1,25 DIHYDROXY
VITAMIN D 1, 25 (OH) TOTAL: 50 pg/mL (ref 18–72)
VITAMIN D3 1, 25 (OH): 50 pg/mL

## 2017-01-05 LAB — COMPLETE METABOLIC PANEL WITH GFR
AG Ratio: 1.7 (calc) (ref 1.0–2.5)
ALBUMIN MSPROF: 4.3 g/dL (ref 3.6–5.1)
ALT: 40 U/L — ABNORMAL HIGH (ref 6–29)
AST: 31 U/L (ref 10–35)
Alkaline phosphatase (APISO): 111 U/L (ref 33–130)
BUN: 13 mg/dL (ref 7–25)
CALCIUM: 9.4 mg/dL (ref 8.6–10.4)
CO2: 27 mmol/L (ref 20–32)
CREATININE: 0.76 mg/dL (ref 0.50–1.05)
Chloride: 106 mmol/L (ref 98–110)
GFR, EST NON AFRICAN AMERICAN: 88 mL/min/{1.73_m2} (ref >=60)
GFR, Est African American: 102 mL/min/{1.73_m2} (ref >=60)
GLOBULIN: 2.6 g/dL (ref 1.9–3.7)
GLUCOSE: 96 mg/dL (ref 65–99)
Potassium: 4.4 mmol/L (ref 3.5–5.3)
SODIUM: 141 mmol/L (ref 135–146)
Total Bilirubin: 0.8 mg/dL (ref 0.2–1.2)
Total Protein: 6.9 g/dL (ref 6.1–8.1)

## 2017-01-05 LAB — LIPID PANEL W/REFLEX DIRECT LDL
CHOLESTEROL: 244 mg/dL — AB (ref <200)
HDL: 100 mg/dL (ref >50)
LDL Cholesterol (Calc): 127 mg/dL (calc) — ABNORMAL HIGH
Non-HDL Cholesterol (Calc): 144 mg/dL (calc) — ABNORMAL HIGH (ref <130)
Total CHOL/HDL Ratio: 2.4 (calc) (ref <5.0)
Triglycerides: 73 mg/dL (ref <150)

## 2017-01-05 LAB — VITAMIN B12: Vitamin B-12: 170 pg/mL — ABNORMAL LOW (ref 200–1100)

## 2017-01-05 LAB — TSH: TSH: 0.29 m[IU]/L — AB (ref 0.40–4.50)

## 2017-01-05 NOTE — Telephone Encounter (Signed)
Fax request placed with front office.

## 2017-02-05 ENCOUNTER — Encounter: Payer: Self-pay | Admitting: Physician Assistant

## 2017-02-08 DIAGNOSIS — R7301 Impaired fasting glucose: Secondary | ICD-10-CM | POA: Diagnosis not present

## 2017-02-08 DIAGNOSIS — E663 Overweight: Secondary | ICD-10-CM | POA: Diagnosis not present

## 2017-03-29 DIAGNOSIS — R7301 Impaired fasting glucose: Secondary | ICD-10-CM | POA: Diagnosis not present

## 2017-03-29 DIAGNOSIS — E663 Overweight: Secondary | ICD-10-CM | POA: Diagnosis not present

## 2017-04-02 ENCOUNTER — Other Ambulatory Visit: Payer: Self-pay | Admitting: Physician Assistant

## 2017-04-05 NOTE — Telephone Encounter (Signed)
Needs labs done. KG LPN

## 2017-05-09 ENCOUNTER — Other Ambulatory Visit: Payer: Self-pay | Admitting: Physician Assistant

## 2017-05-18 ENCOUNTER — Other Ambulatory Visit: Payer: Self-pay

## 2017-05-18 DIAGNOSIS — E039 Hypothyroidism, unspecified: Secondary | ICD-10-CM

## 2017-05-28 DIAGNOSIS — E039 Hypothyroidism, unspecified: Secondary | ICD-10-CM | POA: Diagnosis not present

## 2017-05-28 LAB — TSH: TSH: 1.47 m[IU]/L (ref 0.40–4.50)

## 2017-05-30 ENCOUNTER — Other Ambulatory Visit: Payer: Self-pay | Admitting: Physician Assistant

## 2017-05-30 MED ORDER — LEVOTHYROXINE SODIUM 112 MCG PO TABS: 112.0000 ug | ORAL_TABLET | Freq: Every day | ORAL | 1 refills | Status: DC

## 2017-05-30 NOTE — Progress Notes (Signed)
Perfect. Will refill for 6 months.

## 2017-07-02 ENCOUNTER — Ambulatory Visit: Payer: BLUE CROSS/BLUE SHIELD | Admitting: Physician Assistant

## 2017-07-16 ENCOUNTER — Ambulatory Visit: Payer: BLUE CROSS/BLUE SHIELD | Admitting: Physician Assistant

## 2017-08-09 ENCOUNTER — Ambulatory Visit: Payer: BLUE CROSS/BLUE SHIELD | Admitting: Physician Assistant

## 2017-08-09 DIAGNOSIS — R7301 Impaired fasting glucose: Secondary | ICD-10-CM | POA: Diagnosis not present

## 2017-08-09 DIAGNOSIS — E669 Obesity, unspecified: Secondary | ICD-10-CM | POA: Diagnosis not present

## 2017-08-23 ENCOUNTER — Ambulatory Visit: Payer: BLUE CROSS/BLUE SHIELD | Admitting: Physician Assistant

## 2017-08-27 ENCOUNTER — Ambulatory Visit: Payer: BLUE CROSS/BLUE SHIELD | Admitting: Physician Assistant

## 2017-09-24 ENCOUNTER — Encounter: Payer: Self-pay | Admitting: Physician Assistant

## 2017-09-24 ENCOUNTER — Ambulatory Visit (INDEPENDENT_AMBULATORY_CARE_PROVIDER_SITE_OTHER): Payer: BLUE CROSS/BLUE SHIELD | Admitting: Physician Assistant

## 2017-09-24 VITALS — BP 137/85 | HR 80 | Ht 64.0 in | Wt 184.0 lb

## 2017-09-24 DIAGNOSIS — E039 Hypothyroidism, unspecified: Secondary | ICD-10-CM | POA: Diagnosis not present

## 2017-09-24 DIAGNOSIS — Z6831 Body mass index (BMI) 31.0-31.9, adult: Secondary | ICD-10-CM | POA: Diagnosis not present

## 2017-09-24 DIAGNOSIS — E6609 Other obesity due to excess calories: Secondary | ICD-10-CM

## 2017-09-24 NOTE — Progress Notes (Signed)
l °

## 2017-09-25 ENCOUNTER — Encounter: Payer: Self-pay | Admitting: Physician Assistant

## 2017-09-25 ENCOUNTER — Telehealth: Payer: Self-pay | Admitting: Physician Assistant

## 2017-09-25 DIAGNOSIS — Z6831 Body mass index (BMI) 31.0-31.9, adult: Secondary | ICD-10-CM

## 2017-09-25 DIAGNOSIS — Z6833 Body mass index (BMI) 33.0-33.9, adult: Secondary | ICD-10-CM | POA: Insufficient documentation

## 2017-09-25 DIAGNOSIS — E6609 Other obesity due to excess calories: Secondary | ICD-10-CM | POA: Insufficient documentation

## 2017-09-25 NOTE — Progress Notes (Signed)
Subjective:    Patient ID: Kelly Wheeler, female    DOB: 10/07/1960, 57 y.o.   MRN: [id number]  HPI Pt is a 57 yo female with hypothyroidism who presents to the clinic for 6 month follow up.   Pt is doing great. She is taking her medication every morning. She denies any problems or concerns.   She is trying to get back on track to lose weight again. 6 months ago she was 163 and now she has gained 20lbs back. She just stopped watching what she was eating.   .. Active Ambulatory Problems    Diagnosis Date Noted  . Hypothyroidism 12/09/2009  . Menopausal symptoms 09/12/2013  . Hyperlipidemia 03/28/2014  . Elevated liver enzymes 03/28/2014  . Elevated fasting glucose 02/19/2015  . Anterior cruciate ligament complete tear, left, initial encounter 12/17/2015  . Overweight (BMI 25.0-29.9) 01/01/2017  . Hot flashes 01/02/2017  . B12 deficiency 01/02/2017   Resolved Ambulatory Problems    Diagnosis Date Noted  . EPIDERMOID CYST 05/09/2009  . COSTOCHONDRITIS, LEFT 04/16/2009  . WEIGHT GAIN 05/09/2009  . CHEST PAIN, LEFT 04/16/2009   No Additional Past Medical History     Review of Systems  All other systems reviewed and are negative.      Objective:   Physical Exam  Constitutional: She is oriented to person, place, and time. She appears well-developed and well-nourished.  HENT:  Head: Normocephalic and atraumatic.  Neck: No thyromegaly present.  Cardiovascular: Normal rate and regular rhythm.  Pulmonary/Chest: Effort normal and breath sounds normal.  Neurological: She is alert and oriented to person, place, and time.  Psychiatric: She has a normal mood and affect. Her behavior is normal.          Assessment & Plan:  ..Diagnoses and all orders for this visit:  Acquired hypothyroidism -     Cancel: TSH -     TSH  Class 1 obesity due to excess calories without serious comorbidity with body mass index (BMI) of 31.0 to 31.9 in adult   TSH ordered for follow up  with adjust medication accordingly.  Not due for other fasting labs.   ..Discussed low carb diet with 1500 calories and 80g of protein.  Exercising at least 150 minutes a week.  My Fitness Pal could be a great resource.  Discussed ways to get back on track.   Pt declined flu shot.  Strongly encouraged her to return cologuard kit sent to her for colon cancer screening.  Will obtain MRI at [facility].   ..Spent 30 minutes with patient and greater than 50 percent of visit spent counseling patient regarding treatment plan.  

## 2017-09-25 NOTE — Telephone Encounter (Signed)
[facility] for mammogram. Please fill out fax to get records.

## 2017-09-28 NOTE — Telephone Encounter (Signed)
Done

## 2017-10-11 DIAGNOSIS — E039 Hypothyroidism, unspecified: Secondary | ICD-10-CM | POA: Diagnosis not present

## 2017-10-12 LAB — TSH: TSH: 0.42 m[IU]/L (ref 0.40–4.50)

## 2017-10-12 NOTE — Progress Notes (Signed)
Call pt: thyroid in normal range but just barely more on the hyper side. If feelings ok with no increased anxiety/insomnia then can leave here. What are your thoughts?

## 2017-10-14 ENCOUNTER — Encounter: Payer: Self-pay | Admitting: Physician Assistant

## 2017-10-19 ENCOUNTER — Other Ambulatory Visit: Payer: Self-pay | Admitting: Physician Assistant

## 2017-10-19 DIAGNOSIS — E039 Hypothyroidism, unspecified: Secondary | ICD-10-CM

## 2017-10-19 NOTE — Telephone Encounter (Signed)
Patient just wanted you aware of her symptoms and I have responded to the mychart. Please advise if any changes need to be made.

## 2017-10-19 NOTE — Progress Notes (Signed)
Per last note patient is coming back in 4 weeks for repeat TSH bloodwork.

## 2017-10-21 NOTE — Telephone Encounter (Signed)
Yes if patient would like.

## 2017-11-04 ENCOUNTER — Encounter: Payer: Self-pay | Admitting: Physician Assistant

## 2017-11-08 MED ORDER — LEVOTHYROXINE SODIUM 100 MCG PO TABS: 100.0000 ug | ORAL_TABLET | Freq: Every day | ORAL | 1 refills | Status: DC

## 2017-12-01 ENCOUNTER — Ambulatory Visit (INDEPENDENT_AMBULATORY_CARE_PROVIDER_SITE_OTHER): Payer: BLUE CROSS/BLUE SHIELD | Admitting: Physician Assistant

## 2017-12-01 ENCOUNTER — Encounter: Payer: Self-pay | Admitting: Physician Assistant

## 2017-12-01 VITALS — BP 139/71 | HR 75 | Temp 97.7°F | Ht 64.0 in | Wt 190.0 lb

## 2017-12-01 DIAGNOSIS — R350 Frequency of micturition: Secondary | ICD-10-CM | POA: Diagnosis not present

## 2017-12-01 DIAGNOSIS — R300 Dysuria: Secondary | ICD-10-CM | POA: Diagnosis not present

## 2017-12-01 DIAGNOSIS — J0110 Acute frontal sinusitis, unspecified: Secondary | ICD-10-CM | POA: Diagnosis not present

## 2017-12-01 LAB — POCT URINALYSIS DIPSTICK
Bilirubin, UA: NEGATIVE
Glucose, UA: NEGATIVE
Ketones, UA: NEGATIVE
LEUKOCYTES UA: NEGATIVE
NITRITE UA: NEGATIVE
PH UA: 5 (ref 5.0–8.0)
Protein, UA: NEGATIVE
UROBILINOGEN UA: 0.2 U/dL

## 2017-12-01 MED ORDER — SULFAMETHOXAZOLE-TRIMETHOPRIM 800-160 MG PO TABS: 1.0000 | ORAL_TABLET | Freq: Two times a day (BID) | ORAL | 0 refills | Status: DC

## 2017-12-01 MED ORDER — FLUCONAZOLE 150 MG PO TABS: 150.0000 mg | ORAL_TABLET | Freq: Once | ORAL | 0 refills | Status: AC

## 2017-12-01 NOTE — Patient Instructions (Signed)

## 2017-12-01 NOTE — Progress Notes (Signed)
Subjective:    Patient ID: Kelly Wheeler, female    DOB: [birth year], 57 y.o.   MRN: [id number]  HPI  Pt is a 57 yo female with 9 days of URI symptoms. She reports a lot of sinus pressure more frontal than anywhere else. Mildly productive cough. No fever, SOB, wheezing. She has a lot of sinus drainage and nasal congestion. Tried OTC robutussin, mucinex, sudafed.    3 days of increased urinary frequency and burning with urination. Some slight lower abdominal pain. No flank pain. No fever, chills, body aches. Started drinking some cranberry juice but not tried anything else. Denies any vaginal discharge but has had some itching.   .. Active Ambulatory Problems    Diagnosis Date Noted  . Hypothyroidism 12/09/2009  . Menopausal symptoms 09/12/2013  . Hyperlipidemia 03/28/2014  . Elevated liver enzymes 03/28/2014  . Elevated fasting glucose 02/19/2015  . Anterior cruciate ligament complete tear, left, initial encounter 12/17/2015  . Overweight (BMI 25.0-29.9) 01/01/2017  . Hot flashes 01/02/2017  . B12 deficiency 01/02/2017  . Class 1 obesity due to excess calories without serious comorbidity with body mass index (BMI) of 31.0 to 31.9 in adult 09/25/2017   Resolved Ambulatory Problems    Diagnosis Date Noted  . EPIDERMOID CYST 05/09/2009  . COSTOCHONDRITIS, LEFT 04/16/2009  . WEIGHT GAIN 05/09/2009  . CHEST PAIN, LEFT 04/16/2009   No Additional Past Medical History        Review of Systems See HPI.     Objective:   Physical Exam  Constitutional: She is oriented to person, place, and time. She appears well-developed and well-nourished.  HENT:  Head: Normocephalic and atraumatic.  Right Ear: External ear normal.  Left Ear: External ear normal.  Mouth/Throat: Oropharynx is clear and moist.  TM's clear.  Tenderness over frontal sinuses to palpation.  Bilateral nasal turbinates red and swollen.   Eyes: Conjunctivae are normal.  Neck: Normal range of motion. Neck supple.   Cardiovascular: Normal rate and regular rhythm.  Pulmonary/Chest: Effort normal. She has no wheezes.  Abdominal: Soft. Bowel sounds are normal.  Suprapubic tenderness.  No CVA tenderness.   Lymphadenopathy:    She has no cervical adenopathy.  Neurological: She is alert and oriented to person, place, and time.  Skin: No rash noted.  Psychiatric: She has a normal mood and affect. Her behavior is normal.          Assessment & Plan:  Marland KitchenMarland KitchenDiagnoses and all orders for this visit:  Acute non-recurrent frontal sinusitis -     sulfamethoxazole-trimethoprim (BACTRIM DS,SEPTRA DS) 800-160 MG tablet; Take 1 tablet by mouth 2 (two) times daily. For 7 days.  Urinary frequency -     POCT Urinalysis Dipstick -     Urine Culture -     sulfamethoxazole-trimethoprim (BACTRIM DS,SEPTRA DS) 800-160 MG tablet; Take 1 tablet by mouth 2 (two) times daily. For 7 days.  Dysuria -     sulfamethoxazole-trimethoprim (BACTRIM DS,SEPTRA DS) 800-160 MG tablet; Take 1 tablet by mouth 2 (two) times daily. For 7 days.  Other orders -     fluconazole (DIFLUCAN) 150 MG tablet; Take 1 tablet (150 mg total) by mouth once for 1 dose.     .. Results for orders placed or performed in visit on 12/01/17  POCT Urinalysis Dipstick  Result Value Ref Range   Color, UA yellow    Clarity, UA clear    Glucose, UA Negative Negative   Bilirubin, UA negative    Ketones,  UA negative    Spec Grav, UA >=1.030 (A) 1.010 - 1.025   Blood, UA trace-lysed    pH, UA 5.0 5.0 - 8.0   Protein, UA Negative Negative   Urobilinogen, UA 0.2 0.2 or 1.0 E.U./dL   Nitrite, UA negative    Leukocytes, UA Negative Negative   Appearance     Odor     Will culture.   Treating with bactrim for sinus infection to treat UTI if culture comes back positive. Consider adding flonase. HO given. Honey cough drops for cough during the day. Pt reports some itching and since adding abx risk for yeast infection is high. Will send over diflucan.  Follow up as needed.

## 2017-12-03 LAB — URINE CULTURE
MICRO NUMBER: 91338980
SPECIMEN QUALITY: ADEQUATE

## 2017-12-03 NOTE — Progress Notes (Signed)
Call pt: no urinary infection detected on culture.

## 2017-12-21 DIAGNOSIS — Z1231 Encounter for screening mammogram for malignant neoplasm of breast: Secondary | ICD-10-CM | POA: Diagnosis not present

## 2017-12-21 DIAGNOSIS — N951 Menopausal and female climacteric states: Secondary | ICD-10-CM | POA: Diagnosis not present

## 2017-12-21 DIAGNOSIS — Z01419 Encounter for gynecological examination (general) (routine) without abnormal findings: Secondary | ICD-10-CM | POA: Diagnosis not present

## 2017-12-21 DIAGNOSIS — Z1389 Encounter for screening for other disorder: Secondary | ICD-10-CM | POA: Diagnosis not present

## 2017-12-21 DIAGNOSIS — Z130 Encounter for screening for diseases of the blood and blood-forming organs and certain disorders involving the immune mechanism: Secondary | ICD-10-CM | POA: Diagnosis not present

## 2018-01-25 ENCOUNTER — Other Ambulatory Visit: Payer: Self-pay | Admitting: Physician Assistant

## 2018-01-28 ENCOUNTER — Other Ambulatory Visit: Payer: Self-pay | Admitting: Physician Assistant

## 2018-01-28 DIAGNOSIS — E039 Hypothyroidism, unspecified: Secondary | ICD-10-CM

## 2018-03-23 ENCOUNTER — Other Ambulatory Visit: Payer: Self-pay | Admitting: Physician Assistant

## 2018-03-25 ENCOUNTER — Ambulatory Visit: Payer: BLUE CROSS/BLUE SHIELD | Admitting: Physician Assistant

## 2018-03-29 ENCOUNTER — Ambulatory Visit: Payer: BLUE CROSS/BLUE SHIELD | Admitting: Physician Assistant

## 2018-04-15 ENCOUNTER — Encounter: Payer: Self-pay | Admitting: Physician Assistant

## 2018-04-15 ENCOUNTER — Ambulatory Visit: Payer: BLUE CROSS/BLUE SHIELD | Admitting: Physician Assistant

## 2018-04-15 ENCOUNTER — Other Ambulatory Visit: Payer: Self-pay

## 2018-04-15 VITALS — BP 131/85 | HR 84 | Temp 98.2°F | Ht 64.0 in | Wt 194.0 lb

## 2018-04-15 DIAGNOSIS — Z13220 Encounter for screening for lipoid disorders: Secondary | ICD-10-CM

## 2018-04-15 DIAGNOSIS — Z131 Encounter for screening for diabetes mellitus: Secondary | ICD-10-CM

## 2018-04-15 DIAGNOSIS — E039 Hypothyroidism, unspecified: Secondary | ICD-10-CM

## 2018-04-15 DIAGNOSIS — J3489 Other specified disorders of nose and nasal sinuses: Secondary | ICD-10-CM | POA: Insufficient documentation

## 2018-04-15 DIAGNOSIS — J302 Other seasonal allergic rhinitis: Secondary | ICD-10-CM | POA: Insufficient documentation

## 2018-04-15 MED ORDER — LEVOTHYROXINE SODIUM 100 MCG PO TABS: 100.0000 ug | ORAL_TABLET | Freq: Every day | ORAL | 11 refills | Status: DC

## 2018-04-15 NOTE — Patient Instructions (Signed)
Zyrtec or claritin consider d. flonase nasal spray.    Allergic Rhinitis, Adult Allergic rhinitis is a reaction to allergens in the air. Allergens are tiny specks (particles) in the air that cause your body to have an allergic reaction. This condition cannot be passed from person to person (is not contagious). Allergic rhinitis cannot be cured, but it can be controlled. There are two types of allergic rhinitis:  Seasonal. This type is also called hay fever. It happens only during certain times of the year.  Perennial. This type can happen at any time of the year. What are the causes? This condition may be caused by:  Pollen from grasses, trees, and weeds.  House dust mites.  Pet dander.  Mold. What are the signs or symptoms? Symptoms of this condition include:  Sneezing.  Runny or stuffy nose (nasal congestion).  A lot of mucus in the back of the throat (postnasal drip).  Itchy nose.  Tearing of the eyes.  Trouble sleeping.  Being sleepy during day. How is this treated? There is no cure for this condition. You should avoid things that trigger your symptoms (allergens). Treatment can help to relieve symptoms. This may include:  Medicines that block allergy symptoms, such as antihistamines. These may be given as a shot, nasal spray, or pill.  Shots that are given until your body becomes less sensitive to the allergen (desensitization).  Stronger medicines, if all other treatments have not worked. Follow these instructions at home: Avoiding allergens   Find out what you are allergic to. Common allergens include smoke, dust, and pollen.  Avoid them if you can. These are some of the things that you can do to avoid allergens: ? Replace carpet with wood, tile, or vinyl flooring. Carpet can trap dander and dust. ? Clean any mold found in the home. ? Do not smoke. Do not allow smoking in your home. ? Change your heating and air conditioning filter at least once a month.  ? During allergy season:  Keep windows closed as much as you can. If possible, use air conditioning when there is a lot of pollen in the air.  Use a special filter for allergies with your furnace and air conditioner.  Plan outdoor activities when pollen counts are lowest. This is usually during the early morning or evening hours.  If you do go outdoors when pollen count is high, wear a special mask for people with allergies.  When you come indoors, take a shower and change your clothes before sitting on furniture or bedding. General instructions  Do not use fans in your home.  Do not hang clothes outside to dry.  Wear sunglasses to keep pollen out of your eyes.  Wash your hands right away after you touch household pets.  Take over-the-counter and prescription medicines only as told by your doctor.  Keep all follow-up visits as told by your doctor. This is important. Contact a doctor if:  You have a fever.  You have a cough that does not go away (is persistent).  You start to make whistling sounds when you breathe (wheeze).  Your symptoms do not get better with treatment.  You have thick fluid coming from your nose.  You start to have nosebleeds. Get help right away if:  Your tongue or your lips are swollen.  You have trouble breathing.  You feel dizzy or you feel like you are going to pass out (faint).  You have cold sweats. Summary  Allergic rhinitis is a  reaction to allergens in the air.  This condition may be caused by allergens. These include pollen, dust mites, pet dander, and mold.  Symptoms include a runny, itchy nose, sneezing, or tearing eyes. You may also have trouble sleeping or feel sleepy during the day.  Treatment includes taking medicines and avoiding allergens. You may also get shots or take stronger medicines.  Get help if you have a fever or a cough that does not stop. Get help right away if you are short of breath. This information is not  intended to replace advice given to you by your health care provider. Make sure you discuss any questions you have with your health care provider. Document Released: 05/14/2010 Document Revised: 08/03/2017 Document Reviewed: 08/03/2017 Elsevier Interactive Patient Education  2019 [employer].

## 2018-04-15 NOTE — Progress Notes (Signed)
Subjective:    Patient ID: Kelly Wheeler, female    DOB: November 06, 1960, 58 y.o.   MRN: [id number]  HPI Pt is a 58 yo female with hypothyroidism who presents to the clinic for medication refill.   Pt is doing well. She takes levothyroxine daily. No problems or concerns.   Pt is having some itchy watery eyes and sinus drainage. No facial pain or headache. No fever, chills, SOB, wheezing, body aches. She feels like her allergies are flaring and wonders what to do.   .. Active Ambulatory Problems    Diagnosis Date Noted  . Hypothyroidism 12/09/2009  . Menopausal symptoms 09/12/2013  . Hyperlipidemia 03/28/2014  . Elevated liver enzymes 03/28/2014  . Elevated fasting glucose 02/19/2015  . Anterior cruciate ligament complete tear, left, initial encounter 12/17/2015  . Overweight (BMI 25.0-29.9) 01/01/2017  . Hot flashes 01/02/2017  . B12 deficiency 01/02/2017  . Class 1 obesity due to excess calories without serious comorbidity with body mass index (BMI) of 31.0 to 31.9 in adult 09/25/2017  . Sinus drainage 04/15/2018  . Seasonal allergies 04/15/2018   Resolved Ambulatory Problems    Diagnosis Date Noted  . EPIDERMOID CYST 05/09/2009  . COSTOCHONDRITIS, LEFT 04/16/2009  . WEIGHT GAIN 05/09/2009  . CHEST PAIN, LEFT 04/16/2009   No Additional Past Medical History      Review of Systems  All other systems reviewed and are negative.      Objective:   Physical Exam Vitals signs reviewed.  Constitutional:      Appearance: She is obese.  HENT:     Head: Normocephalic.     Right Ear: Tympanic membrane normal.     Left Ear: Tympanic membrane normal.     Nose: Congestion present.     Mouth/Throat:     Pharynx: Oropharynx is clear. No posterior oropharyngeal erythema.  Neck:     Musculoskeletal: Normal range of motion.     Comments: No thyroid enlargement.  Cardiovascular:     Rate and Rhythm: Normal rate and regular rhythm.     Pulses: Normal pulses.  Pulmonary:   Effort: Pulmonary effort is normal.     Breath sounds: Normal breath sounds.  Neurological:     General: No focal deficit present.     Mental Status: She is alert and oriented to person, place, and time.  Psychiatric:        Mood and Affect: Mood normal.           Assessment & Plan:  Marland KitchenMarland KitchenMargherita was seen today for follow-up.  Diagnoses and all orders for this visit:  Acquired hypothyroidism -     TSH -     levothyroxine (SYNTHROID) 100 MCG tablet; Take 1 tablet (100 mcg total) by mouth daily.  Screening for lipid disorders -     Lipid Panel w/reflex Direct LDL  Screening for diabetes mellitus -     COMPLETE METABOLIC PANEL WITH GFR  Sinus drainage  Seasonal allergies   .Marland Kitchen Depression screen [facility] 2/9 04/15/2018 09/24/2017  Decreased Interest 0 0  Down, Depressed, Hopeless 0 0  PHQ - 2 Score 0 0  Altered sleeping 1 1  Tired, decreased energy 0 1  Change in appetite 2 1  Feeling bad or failure about yourself  0 0  Trouble concentrating 0 0  Moving slowly or fidgety/restless 0 0  Suicidal thoughts 0 0  PHQ-9 Score 3 3  Difficult doing work/chores Not difficult at all Not difficult at all   .Marland Kitchen GAD  7 : Generalized Anxiety Score 04/15/2018 09/24/2017  Nervous, Anxious, on Edge 0 0  Control/stop worrying 0 0  Worry too much - different things 0 0  Trouble relaxing 1 0  Restless 0 0  Easily annoyed or irritable 1 0  Afraid - awful might happen 0 1  Total GAD 7 Score 2 1  Anxiety Difficulty Not difficult at all Not difficult at all    Labs ordered for screening as well.   Refilled thyroid medication.   Discussed seasonal allergies. Consider started zyrtec/claritin D with flonase. HO given. Encouraged nasal saline rinses. Follow up as needed.

## 2018-04-24 ENCOUNTER — Encounter: Payer: Self-pay | Admitting: Physician Assistant

## 2018-04-25 ENCOUNTER — Encounter: Payer: Self-pay | Admitting: Physician Assistant

## 2018-04-25 ENCOUNTER — Other Ambulatory Visit: Payer: Self-pay | Admitting: Physician Assistant

## 2018-04-25 MED ORDER — AMOXICILLIN-POT CLAVULANATE 875-125 MG PO TABS: 1.0000 | ORAL_TABLET | Freq: Two times a day (BID) | ORAL | 0 refills | Status: DC

## 2018-04-25 NOTE — Progress Notes (Signed)
Pt was seen recently and sinus symptoms not improved but worsened. Sent augmentin for 10 days. Follow up as needed.

## 2018-04-28 MED ORDER — FLUCONAZOLE 150 MG PO TABS: 150.0000 mg | ORAL_TABLET | Freq: Once | ORAL | 0 refills | Status: AC

## 2018-04-28 NOTE — Telephone Encounter (Signed)
Pt having GI upset with amoxicillin and some vaginal itching. Will send diflucan. Encouraged her to take with food and start probiotic.

## 2018-05-15 IMAGING — MR MR KNEE*L* W/O CM
6 series · 40 of 40 positions shown · non-contrast
Comparison: 12/17/2015

CLINICAL DATA: Knee swelling and posterior pain after fall last
[REDACTED].

EXAM:
MRI OF THE LEFT KNEE WITHOUT CONTRAST
TECHNIQUE: Multiplanar, multisequence MR imaging of the knee was performed. No
intravenous contrast was administered.

[Series 3: PD fat-sat · axial · 3.0mm · 0.33mm/px · z∈[-85,+27]mm · 8 of 35 slices shown (1 of 4)]
[im 1/35]
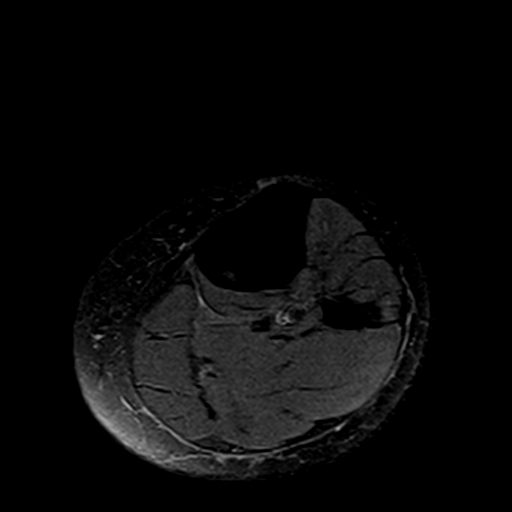
[im 5/35]
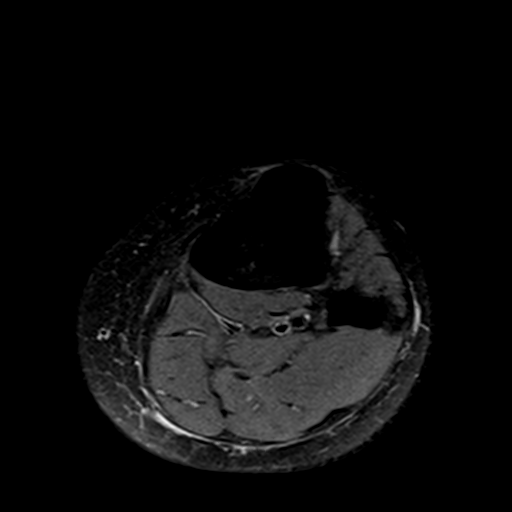
[im 10/35]
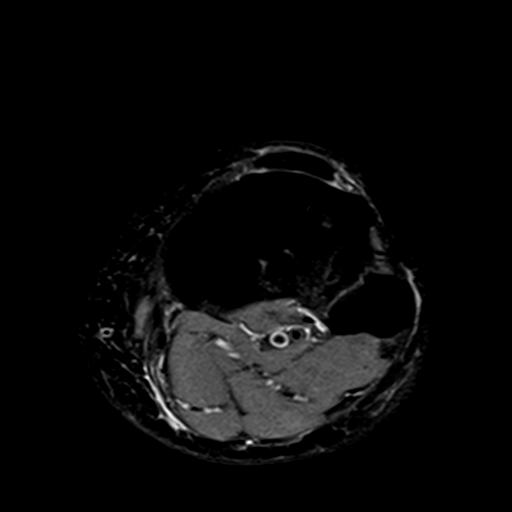
[im 15/35]
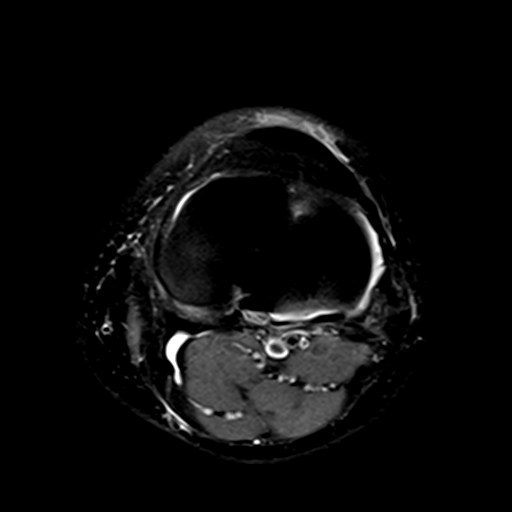
[im 20/35]
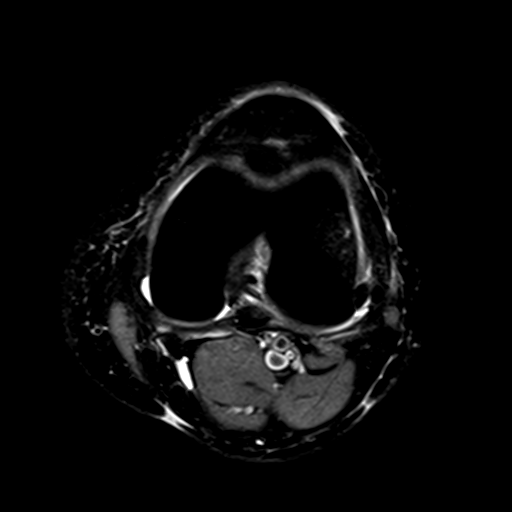
[im 25/35]
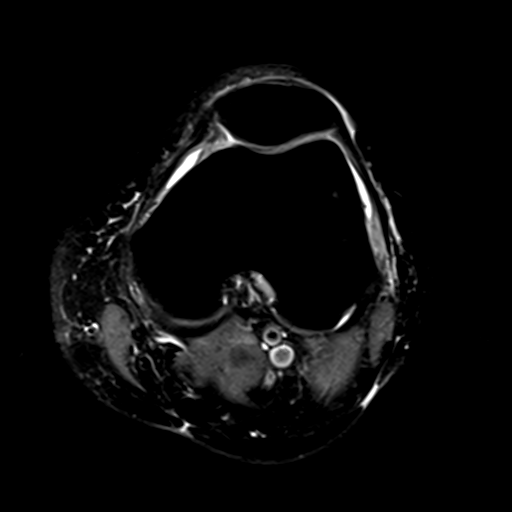
[im 30/35]
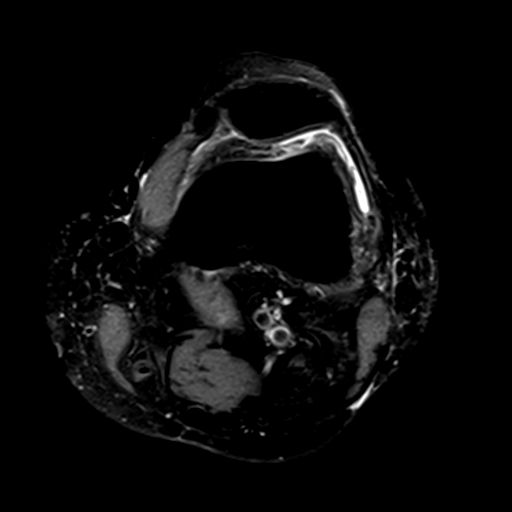
[im 35/35]
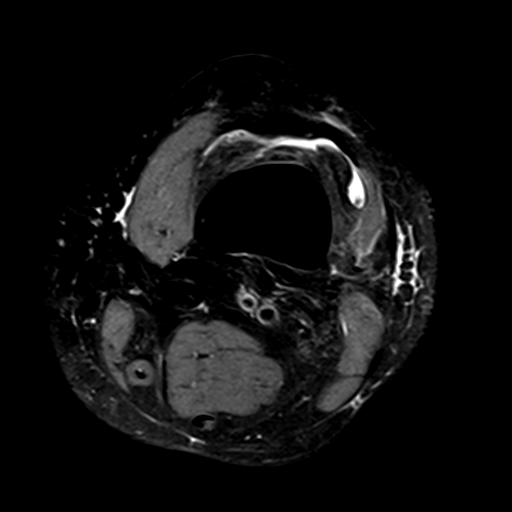

[Series 4: T1 · coronal · 3.0mm · 0.50mm/px · 7 of 31 slices shown]
[im 1/31]
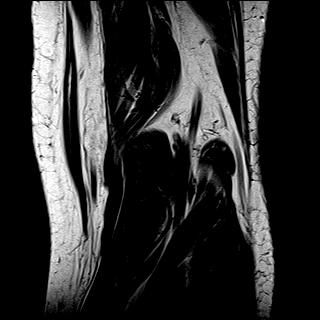
[im 6/31]
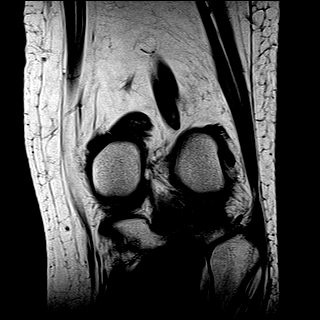
[im 11/31]
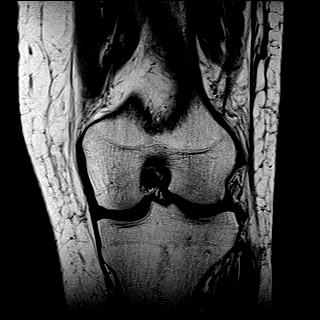
[im 16/31]
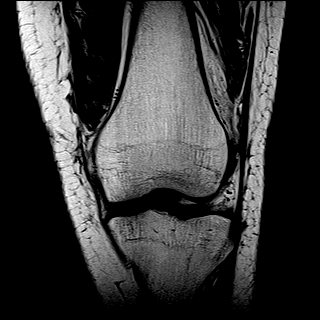
[im 21/31]
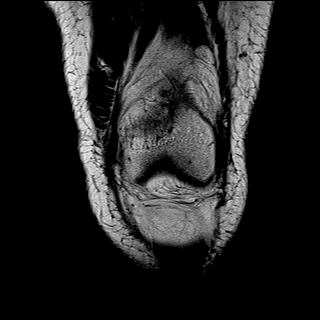
[im 26/31]
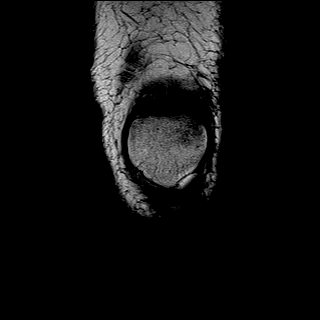
[im 31/31]
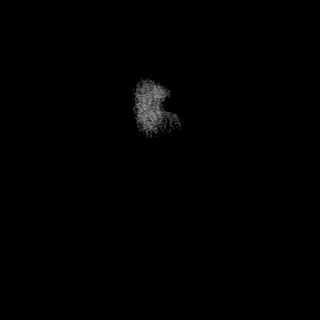

[Series 5: T2 fat-sat · coronal · 3.0mm · 0.50mm/px · 7 of 31 slices shown]
[im 1/31]
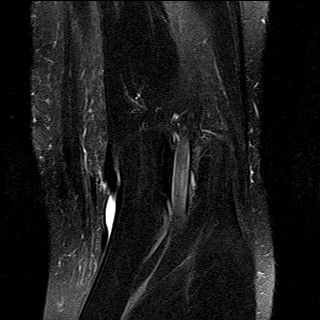
[im 6/31]
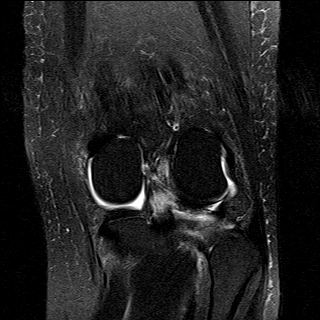
[im 11/31]
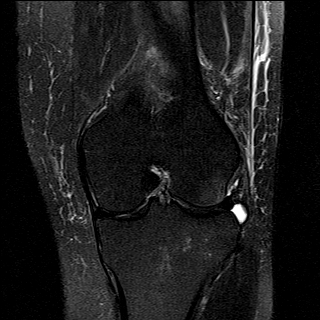
[im 16/31]
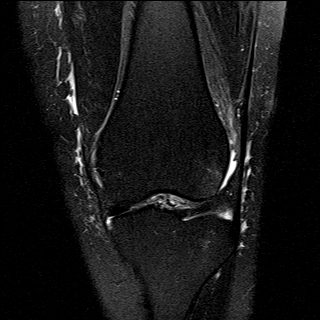
[im 21/31]
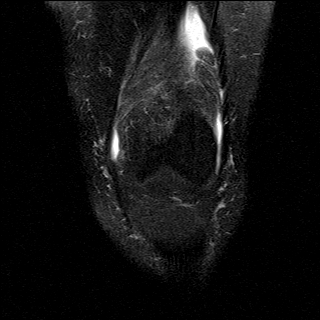
[im 26/31]
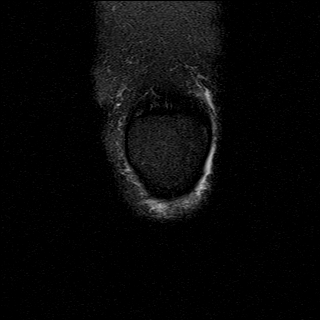
[im 31/31]
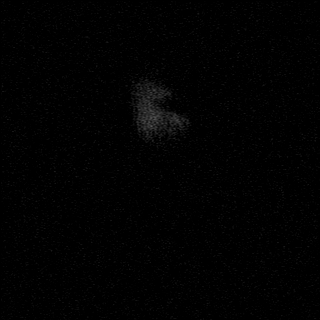

[Series 6: PD fat-sat · coronal · 3.0mm · 0.62mm/px · 7 of 31 slices shown (2 of 4)]
[im 1/31]
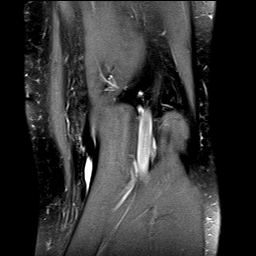
[im 6/31]
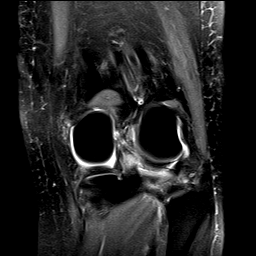
[im 11/31]
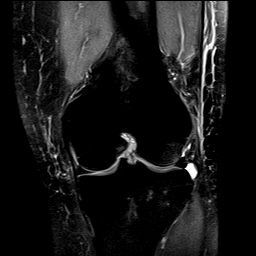
[im 16/31]
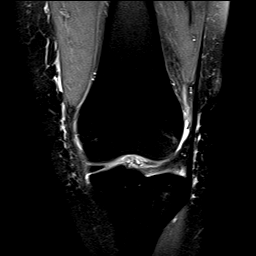
[im 21/31]
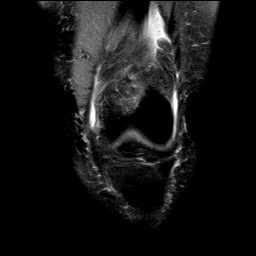
[im 26/31]
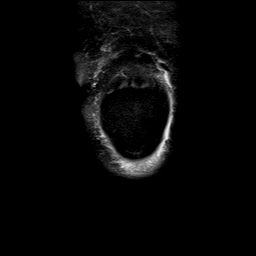
[im 31/31]
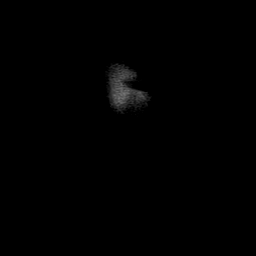

[Series 7: PD fat-sat · sagittal · 3.0mm · 0.62mm/px · 8 of 35 slices shown (3 of 4)]
[im 1/35]
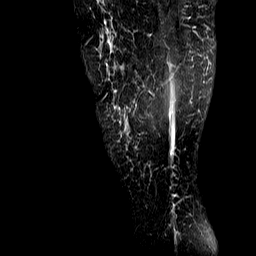
[im 5/35]
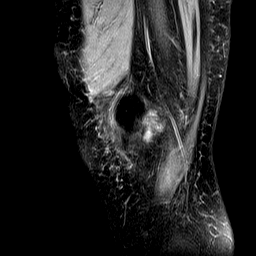
[im 10/35]
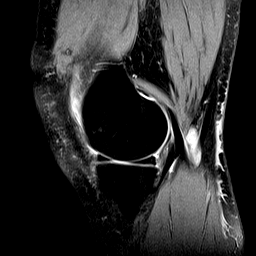
[im 15/35]
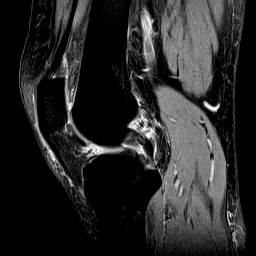
[im 20/35]
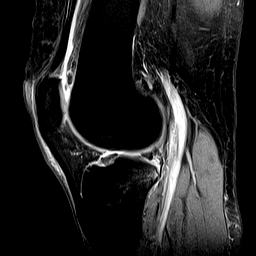
[im 25/35]
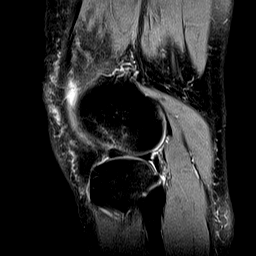
[im 30/35]
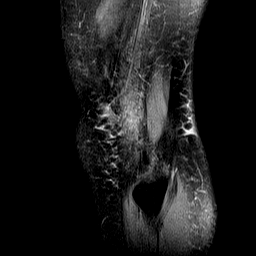
[im 35/35]
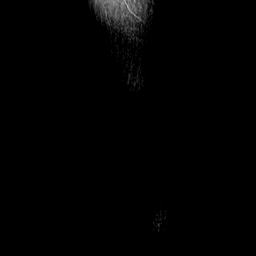

[Series 8: PD fat-sat · coronal · 2.0mm · 0.62mm/px · 3 of 12 slices shown (4 of 4)]
[im 1/12]
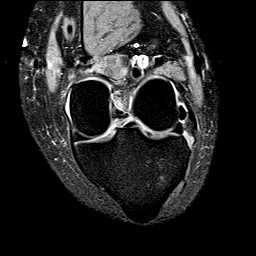
[im 6/12]
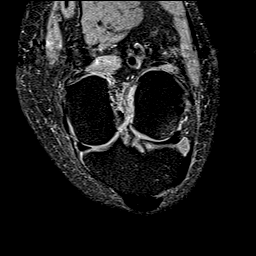
[im 12/12]
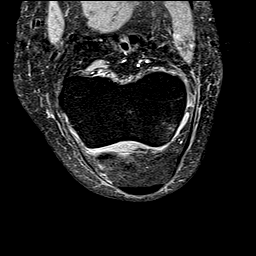

[40 of 40 positions shown; findings below may reference images not displayed]

FINDINGS: MENISCI

Medial meniscus:  Unremarkable

Lateral meniscus:  Unremarkable

LIGAMENTS

Cruciates:  Torn anterior cruciate ligament.

Collaterals:  Unremarkable

CARTILAGE

Patellofemoral: Generally moderate chondral thinning most striking
along the medial patellar facet.

Medial:  Mild to moderate degenerative chondral thinning.

Lateral: Mild chondral thinning. Bone bruising posteriorly in the
lateral tibial plateau and anterolaterally in the lateral femoral
condyle characteristic of a pivot-shift mechanism of injury.

Joint:  Small knee effusion.

Popliteal Fossa:  Small Baker's cyst.

Extensor Mechanism:  Prepatellar subcutaneous edema.

Bones:  Lateral compartmental bone bruising as noted above.

Other: No supplemental non-categorized findings.
IMPRESSION: 1. Complete ACL tear with lateral compartment nodal bone bruising
characteristic a pivot-shift mechanism of injury.
2. Mild to moderate degenerative chondral thinning
3. Small knee effusion and small Baker's cyst.
4. Prepatellar subcutaneous edema.

## 2018-06-24 DIAGNOSIS — Z131 Encounter for screening for diabetes mellitus: Secondary | ICD-10-CM | POA: Diagnosis not present

## 2018-06-24 DIAGNOSIS — Z13220 Encounter for screening for lipoid disorders: Secondary | ICD-10-CM | POA: Diagnosis not present

## 2018-06-24 DIAGNOSIS — E039 Hypothyroidism, unspecified: Secondary | ICD-10-CM | POA: Diagnosis not present

## 2018-06-25 LAB — COMPLETE METABOLIC PANEL WITH GFR
AG Ratio: 1.9 (calc) (ref 1.0–2.5)
ALKALINE PHOSPHATASE (APISO): 76 U/L (ref 37–153)
ALT: 91 U/L — ABNORMAL HIGH (ref 6–29)
AST: 98 U/L — ABNORMAL HIGH (ref 10–35)
Albumin: 4.2 g/dL (ref 3.6–5.1)
BUN: 11 mg/dL (ref 7–25)
CHLORIDE: 106 mmol/L (ref 98–110)
CO2: 24 mmol/L (ref 20–32)
Calcium: 9 mg/dL (ref 8.6–10.4)
Creat: 0.86 mg/dL (ref 0.50–1.05)
GFR, Est African American: 86 mL/min/{1.73_m2} (ref >=60)
GFR, Est Non African American: 74 mL/min/{1.73_m2} (ref >=60)
Globulin: 2.2 g/dL (calc) (ref 1.9–3.7)
Glucose, Bld: 95 mg/dL (ref 65–99)
Potassium: 4.2 mmol/L (ref 3.5–5.3)
Sodium: 141 mmol/L (ref 135–146)
TOTAL PROTEIN: 6.4 g/dL (ref 6.1–8.1)
Total Bilirubin: 1 mg/dL (ref 0.2–1.2)

## 2018-06-25 LAB — TSH: TSH: 9.4 m[IU]/L — ABNORMAL HIGH (ref 0.40–4.50)

## 2018-06-25 LAB — LIPID PANEL W/REFLEX DIRECT LDL
Cholesterol: 252 mg/dL — ABNORMAL HIGH (ref <200)
HDL: 81 mg/dL (ref >=50)
LDL Cholesterol (Calc): 149 mg/dL (calc) — ABNORMAL HIGH
Non-HDL Cholesterol (Calc): 171 mg/dL (calc) — ABNORMAL HIGH (ref <130)
Total CHOL/HDL Ratio: 3.1 (calc) (ref <5.0)
Triglycerides: 105 mg/dL (ref <150)

## 2018-06-27 NOTE — Progress Notes (Signed)
Call pt: TSH is elevated. If you have been taking 151mcg daily in am before eating we need to increase and recheck in 6 weeks.   LdL elevated more from 1 year ago. HDL went down but still great. Overall 10 year risk is 2.5 percent. Continue working on good fats and limit process foods.   Kidney, glucose look good.   Elevated liver enzymes more than normal. More alcohol? More tylenol?

## 2018-06-28 ENCOUNTER — Encounter: Payer: Self-pay | Admitting: Physician Assistant

## 2018-06-28 ENCOUNTER — Telehealth: Payer: Self-pay | Admitting: Neurology

## 2018-06-28 DIAGNOSIS — E039 Hypothyroidism, unspecified: Secondary | ICD-10-CM

## 2018-06-28 DIAGNOSIS — R748 Abnormal levels of other serum enzymes: Secondary | ICD-10-CM

## 2018-06-28 MED ORDER — LEVOTHYROXINE SODIUM 112 MCG PO TABS: 112.0000 ug | ORAL_TABLET | Freq: Every day | ORAL | 1 refills | Status: DC

## 2018-06-28 NOTE — Progress Notes (Signed)
[place] sent. Please order cmp and TSH for 6 weeks.

## 2018-06-28 NOTE — Telephone Encounter (Signed)
-----   Message from Donella Stade, [place] sent at 06/28/2018 11:50 AM EDT ----- Madaline Brilliant sent. Please order cmp and TSH for 6 weeks.

## 2018-06-28 NOTE — Addendum Note (Signed)
Addended by: Donella Stade on: 06/28/2018 11:50 AM   Modules accepted: Orders

## 2018-06-28 NOTE — Telephone Encounter (Signed)
Labs ordered.

## 2018-08-21 ENCOUNTER — Other Ambulatory Visit: Payer: Self-pay | Admitting: Physician Assistant

## 2018-08-27 DIAGNOSIS — R740 Nonspecific elevation of levels of transaminase and lactic acid dehydrogenase [LDH]: Secondary | ICD-10-CM | POA: Diagnosis not present

## 2018-08-27 DIAGNOSIS — E039 Hypothyroidism, unspecified: Secondary | ICD-10-CM | POA: Diagnosis not present

## 2018-08-27 DIAGNOSIS — J129 Viral pneumonia, unspecified: Secondary | ICD-10-CM | POA: Diagnosis not present

## 2018-08-27 DIAGNOSIS — Z20828 Contact with and (suspected) exposure to other viral communicable diseases: Secondary | ICD-10-CM | POA: Diagnosis not present

## 2018-08-27 DIAGNOSIS — R918 Other nonspecific abnormal finding of lung field: Secondary | ICD-10-CM | POA: Diagnosis not present

## 2018-08-27 DIAGNOSIS — J1289 Other viral pneumonia: Secondary | ICD-10-CM | POA: Diagnosis not present

## 2018-08-27 DIAGNOSIS — U071 COVID-19: Secondary | ICD-10-CM | POA: Diagnosis not present

## 2018-08-31 ENCOUNTER — Other Ambulatory Visit: Payer: Self-pay | Admitting: Physician Assistant

## 2018-08-31 ENCOUNTER — Encounter: Payer: Self-pay | Admitting: Physician Assistant

## 2018-08-31 MED ORDER — AZITHROMYCIN 250 MG PO TABS: ORAL_TABLET | ORAL | 0 refills | Status: DC

## 2018-08-31 MED ORDER — ALBUTEROL SULFATE HFA 108 (90 BASE) MCG/ACT IN AERS: 2.0000 | INHALATION_SPRAY | Freq: Four times a day (QID) | RESPIRATORY_TRACT | 1 refills | Status: DC | PRN

## 2018-09-08 DIAGNOSIS — Z20828 Contact with and (suspected) exposure to other viral communicable diseases: Secondary | ICD-10-CM | POA: Diagnosis not present

## 2018-09-12 ENCOUNTER — Telehealth (INDEPENDENT_AMBULATORY_CARE_PROVIDER_SITE_OTHER): Payer: BC Managed Care – PPO | Admitting: Physician Assistant

## 2018-09-12 ENCOUNTER — Encounter: Payer: Self-pay | Admitting: Physician Assistant

## 2018-09-12 VITALS — Ht 64.0 in | Wt 183.0 lb

## 2018-09-12 DIAGNOSIS — Z20822 Contact with and (suspected) exposure to covid-19: Secondary | ICD-10-CM

## 2018-09-12 DIAGNOSIS — J129 Viral pneumonia, unspecified: Secondary | ICD-10-CM | POA: Diagnosis not present

## 2018-09-12 DIAGNOSIS — Z20828 Contact with and (suspected) exposure to other viral communicable diseases: Secondary | ICD-10-CM

## 2018-09-12 NOTE — Progress Notes (Deleted)
Patient following up from hospitalization for pneumonia.  Has tested negative for Covid x 2. Husband was positive.  Still having some SOB/weakness.  Has been out of work since 08/19/2018. Wants to return Thursday.

## 2018-09-12 NOTE — Progress Notes (Signed)
Patient ID: Kelly Wheeler, female   DOB: 28-Aug-1960, 58 y.o.   MRN: [id number] .Virtual Visit via Video Note  I connected with ANNIA GOMM on 09/12/18 at  3:40 PM EDT by a video enabled telemedicine application and verified that I am speaking with the correct person using two identifiers.  Location: Patient: home Provider: clinic   I discussed the limitations of evaluation and management by telemedicine and the availability of in person appointments. The patient expressed understanding and agreed to proceed.  History of Present Illness: Pt is a 58 yo female who calls into the clinic to follow up on almost 6 weeks of COVID like symptoms and viral pneumonia. Pt suddenly started feeling bad 08/23/18. She went to ED on 08/27/18 and dx with viral pneumonia and given symptomatic care. She was tested for COVID x2 and negative both times despite her husband testing positive. She continued to have cough, SOB, weakness, congestion, headaches. She called clinic on 08/31/18 and I gave her a zpak for pneumonia. She has been feeling better since. She finished zpak. She continues to have some intermittent nausea, sinus drainage and weakness but she is feeling much better.  No fever, chills, body aches, SOB. Her husband continues to have some symptoms. He was re-tested today for covid to see if still shedding virus.   Pt needs to know when she can return to work. She works in a Teacher, music.   .. Active Ambulatory Problems    Diagnosis Date Noted  . Hypothyroidism 12/09/2009  . Menopausal symptoms 09/12/2013  . Hyperlipidemia 03/28/2014  . Elevated liver enzymes 03/28/2014  . Elevated fasting glucose 02/19/2015  . Anterior cruciate ligament complete tear, left, initial encounter 12/17/2015  . Overweight (BMI 25.0-29.9) 01/01/2017  . Hot flashes 01/02/2017  . B12 deficiency 01/02/2017  . Class 1 obesity due to excess calories without serious comorbidity with body mass index (BMI) of 31.0 to 31.9 in adult  09/25/2017  . Sinus drainage 04/15/2018  . Seasonal allergies 04/15/2018   Resolved Ambulatory Problems    Diagnosis Date Noted  . EPIDERMOID CYST 05/09/2009  . COSTOCHONDRITIS, LEFT 04/16/2009  . WEIGHT GAIN 05/09/2009  . CHEST PAIN, LEFT 04/16/2009   No Additional Past Medical History      Observations/Objective: No acute distress.  No labored breathing.  Normal mood.  Dry cough.   .. Today's Vitals   09/12/18 1520  Weight: 183 lb (83 kg)  Height: 5\' 4"  (1.626 m)   Body mass index is 31.41 kg/m.   Assessment and Plan: Marland KitchenMarland KitchenAdelis was seen today for follow-up.  Diagnoses and all orders for this visit:  Viral pneumonia  Close Exposure to Covid-19 Virus  Pt is doing much better. Certainly sounds like she likely had COVID-19; however, testing was negative. Based on her symptoms I believe she could go back to work now. Concerned about husband continuing to be symptomatic. He was re-tested for COVID today. I would like her to stay out of work until husbands test results come back. If he is no longer shedding virus then patient ok to go back to work. I will write letter after husband results come in to the day she can go back.   Follow Up Instructions:    I discussed the assessment and treatment plan with the patient. The patient was provided an opportunity to ask questions and all were answered. The patient agreed with the plan and demonstrated an understanding of the instructions.   The patient was advised to call  back or seek an in-person evaluation if the symptoms worsen or if the condition fails to improve as anticipated.  I provided 10 minutes of non-face-to-face time during this encounter.   Iran Planas, PA-C

## 2018-09-19 ENCOUNTER — Encounter: Payer: Self-pay | Admitting: Physician Assistant

## 2018-09-19 NOTE — Telephone Encounter (Signed)
Ok to write up letter from July 24 to august 24th. She may return to work aug 25th full duty and no restrictions.

## 2018-09-20 NOTE — Telephone Encounter (Signed)
Can you please make the date change. Thanks.

## 2018-11-01 ENCOUNTER — Encounter: Payer: Self-pay | Admitting: Physician Assistant

## 2018-11-01 DIAGNOSIS — L659 Nonscarring hair loss, unspecified: Secondary | ICD-10-CM

## 2018-11-01 DIAGNOSIS — Z13220 Encounter for screening for lipoid disorders: Secondary | ICD-10-CM

## 2018-11-01 DIAGNOSIS — Z131 Encounter for screening for diabetes mellitus: Secondary | ICD-10-CM

## 2018-11-01 DIAGNOSIS — E559 Vitamin D deficiency, unspecified: Secondary | ICD-10-CM

## 2018-11-02 ENCOUNTER — Other Ambulatory Visit: Payer: Self-pay | Admitting: Family Medicine

## 2018-11-04 DIAGNOSIS — L659 Nonscarring hair loss, unspecified: Secondary | ICD-10-CM | POA: Diagnosis not present

## 2018-11-04 DIAGNOSIS — Z13220 Encounter for screening for lipoid disorders: Secondary | ICD-10-CM | POA: Diagnosis not present

## 2018-11-04 DIAGNOSIS — Z131 Encounter for screening for diabetes mellitus: Secondary | ICD-10-CM | POA: Diagnosis not present

## 2018-11-04 DIAGNOSIS — E559 Vitamin D deficiency, unspecified: Secondary | ICD-10-CM | POA: Diagnosis not present

## 2018-11-07 DIAGNOSIS — E559 Vitamin D deficiency, unspecified: Secondary | ICD-10-CM | POA: Insufficient documentation

## 2018-11-07 MED ORDER — VITAMIN D (ERGOCALCIFEROL) 1.25 MG (50000 UNIT) PO CAPS: 50000.0000 [IU] | ORAL_CAPSULE | ORAL | 0 refills | Status: DC

## 2018-11-07 NOTE — Telephone Encounter (Signed)
Caroline More,   Thyroid is much better!  Fasting sugar was up some. Lets add A!C to look for diabetes.  Kidney looks great.  Liver enzymes were better than 4 months ago.  No anemia.  Vitamin D levels very low.  Inflammation rate was low.   Thyroid changes could cause some sudden hair loss as normalizes. Lets start vitamin D 50,000 units once a week for 3 months and then recheck vitamin D. Do you need thyroid medication?   -Luvenia Starch

## 2018-11-07 NOTE — Addendum Note (Signed)
Addended by: Donella Stade on: 11/07/2018 07:22 AM   Modules accepted: Orders

## 2018-11-08 LAB — COMPLETE METABOLIC PANEL WITH GFR
AG Ratio: 1.6 (calc) (ref 1.0–2.5)
ALT: 82 U/L — ABNORMAL HIGH (ref 6–29)
AST: 76 U/L — ABNORMAL HIGH (ref 10–35)
Albumin: 3.9 g/dL (ref 3.6–5.1)
Alkaline phosphatase (APISO): 70 U/L (ref 37–153)
BUN: 12 mg/dL (ref 7–25)
CO2: 26 mmol/L (ref 20–32)
Calcium: 8.9 mg/dL (ref 8.6–10.4)
Chloride: 107 mmol/L (ref 98–110)
Creat: 0.72 mg/dL (ref 0.50–1.05)
GFR, Est African American: 107 mL/min/{1.73_m2} (ref >=60)
GFR, Est Non African American: 92 mL/min/{1.73_m2} (ref >=60)
Globulin: 2.5 g/dL (calc) (ref 1.9–3.7)
Glucose, Bld: 107 mg/dL — ABNORMAL HIGH (ref 65–99)
Potassium: 4.2 mmol/L (ref 3.5–5.3)
Sodium: 141 mmol/L (ref 135–146)
Total Bilirubin: 0.6 mg/dL (ref 0.2–1.2)
Total Protein: 6.4 g/dL (ref 6.1–8.1)

## 2018-11-08 LAB — CBC WITH DIFFERENTIAL/PLATELET
Absolute Monocytes: 385 cells/uL (ref 200–950)
Basophils Absolute: 19 cells/uL (ref 0–200)
Basophils Relative: 0.4 %
Eosinophils Absolute: 150 cells/uL (ref 15–500)
Eosinophils Relative: 3.2 %
HCT: 40.8 % (ref 35.0–45.0)
Hemoglobin: 13.5 g/dL (ref 11.7–15.5)
Lymphs Abs: 1452 cells/uL (ref 850–3900)
MCH: 30.2 pg (ref 27.0–33.0)
MCHC: 33.1 g/dL (ref 32.0–36.0)
MCV: 91.3 fL (ref 80.0–100.0)
MPV: 11.6 fL (ref 7.5–12.5)
Monocytes Relative: 8.2 %
Neutro Abs: 2693 cells/uL (ref 1500–7800)
Neutrophils Relative %: 57.3 %
Platelets: 155 10*3/uL (ref 140–400)
RBC: 4.47 10*6/uL (ref 3.80–5.10)
RDW: 13.8 % (ref 11.0–15.0)
Total Lymphocyte: 30.9 %
WBC: 4.7 10*3/uL (ref 3.8–10.8)

## 2018-11-08 LAB — FSH/LH
FSH: 99 m[IU]/mL
LH: 42 m[IU]/mL

## 2018-11-08 LAB — SEDIMENTATION RATE: Sed Rate: 11 mm/h (ref 0–30)

## 2018-11-08 LAB — FERRITIN: Ferritin: 179 ng/mL (ref 16–232)

## 2018-11-08 LAB — LIPID PANEL W/REFLEX DIRECT LDL
Cholesterol: 266 mg/dL — ABNORMAL HIGH (ref <200)
HDL: 86 mg/dL (ref >=50)
LDL Cholesterol (Calc): 157 mg/dL (calc) — ABNORMAL HIGH
Non-HDL Cholesterol (Calc): 180 mg/dL (calc) — ABNORMAL HIGH (ref <130)
Total CHOL/HDL Ratio: 3.1 (calc) (ref <5.0)
Triglycerides: 114 mg/dL (ref <150)

## 2018-11-08 LAB — TSH: TSH: 2.16 mIU/L (ref 0.40–4.50)

## 2018-11-08 LAB — HEMOGLOBIN A1C W/OUT EAG: Hgb A1c MFr Bld: 5.3 % of total Hgb (ref <5.7)

## 2018-11-08 LAB — VITAMIN D 25 HYDROXY (VIT D DEFICIENCY, FRACTURES): Vit D, 25-Hydroxy: 12 ng/mL — ABNORMAL LOW (ref 30–100)

## 2018-11-08 NOTE — Telephone Encounter (Signed)
A!C is 5.3 normal range. No diabetes.

## 2018-11-22 DIAGNOSIS — H5213 Myopia, bilateral: Secondary | ICD-10-CM | POA: Diagnosis not present

## 2018-12-04 ENCOUNTER — Other Ambulatory Visit: Payer: Self-pay | Admitting: Physician Assistant

## 2018-12-20 ENCOUNTER — Other Ambulatory Visit: Payer: Self-pay | Admitting: Physician Assistant

## 2019-01-27 ENCOUNTER — Other Ambulatory Visit: Payer: Self-pay | Admitting: Physician Assistant

## 2019-02-18 ENCOUNTER — Other Ambulatory Visit: Payer: Self-pay | Admitting: Physician Assistant

## 2019-04-13 DIAGNOSIS — Z1231 Encounter for screening mammogram for malignant neoplasm of breast: Secondary | ICD-10-CM | POA: Diagnosis not present

## 2019-04-13 DIAGNOSIS — Z01419 Encounter for gynecological examination (general) (routine) without abnormal findings: Secondary | ICD-10-CM | POA: Diagnosis not present

## 2019-04-13 DIAGNOSIS — Z130 Encounter for screening for diseases of the blood and blood-forming organs and certain disorders involving the immune mechanism: Secondary | ICD-10-CM | POA: Diagnosis not present

## 2019-04-13 DIAGNOSIS — N951 Menopausal and female climacteric states: Secondary | ICD-10-CM | POA: Diagnosis not present

## 2019-04-13 DIAGNOSIS — Z1389 Encounter for screening for other disorder: Secondary | ICD-10-CM | POA: Diagnosis not present

## 2019-04-13 LAB — HM MAMMOGRAPHY

## 2019-05-02 ENCOUNTER — Other Ambulatory Visit: Payer: Self-pay | Admitting: Physician Assistant

## 2019-05-04 DIAGNOSIS — R7301 Impaired fasting glucose: Secondary | ICD-10-CM | POA: Diagnosis not present

## 2019-05-17 ENCOUNTER — Encounter: Payer: Self-pay | Admitting: Physician Assistant

## 2019-05-17 DIAGNOSIS — E039 Hypothyroidism, unspecified: Secondary | ICD-10-CM

## 2019-05-17 DIAGNOSIS — E559 Vitamin D deficiency, unspecified: Secondary | ICD-10-CM

## 2019-05-17 DIAGNOSIS — Z79899 Other long term (current) drug therapy: Secondary | ICD-10-CM

## 2019-05-17 DIAGNOSIS — R7301 Impaired fasting glucose: Secondary | ICD-10-CM

## 2019-06-05 ENCOUNTER — Other Ambulatory Visit: Payer: Self-pay | Admitting: Physician Assistant

## 2019-06-13 ENCOUNTER — Encounter: Payer: Self-pay | Admitting: Physician Assistant

## 2019-06-14 DIAGNOSIS — E559 Vitamin D deficiency, unspecified: Secondary | ICD-10-CM | POA: Diagnosis not present

## 2019-06-14 DIAGNOSIS — H5213 Myopia, bilateral: Secondary | ICD-10-CM | POA: Diagnosis not present

## 2019-06-14 DIAGNOSIS — R7301 Impaired fasting glucose: Secondary | ICD-10-CM | POA: Diagnosis not present

## 2019-06-14 DIAGNOSIS — H5210 Myopia, unspecified eye: Secondary | ICD-10-CM | POA: Diagnosis not present

## 2019-06-14 DIAGNOSIS — Z79899 Other long term (current) drug therapy: Secondary | ICD-10-CM | POA: Diagnosis not present

## 2019-06-14 DIAGNOSIS — E039 Hypothyroidism, unspecified: Secondary | ICD-10-CM | POA: Diagnosis not present

## 2019-06-15 LAB — COMPLETE METABOLIC PANEL WITH GFR
AG Ratio: 1.5 (calc) (ref 1.0–2.5)
ALT: 42 U/L — ABNORMAL HIGH (ref 6–29)
AST: 40 U/L — ABNORMAL HIGH (ref 10–35)
Albumin: 4 g/dL (ref 3.6–5.1)
Alkaline phosphatase (APISO): 86 U/L (ref 37–153)
BUN: 14 mg/dL (ref 7–25)
CO2: 28 mmol/L (ref 20–32)
Calcium: 9.3 mg/dL (ref 8.6–10.4)
Chloride: 106 mmol/L (ref 98–110)
Creat: 0.89 mg/dL (ref 0.50–1.05)
GFR, Est African American: 82 mL/min/{1.73_m2} (ref >=60)
GFR, Est Non African American: 71 mL/min/{1.73_m2} (ref >=60)
Globulin: 2.6 g/dL (calc) (ref 1.9–3.7)
Glucose, Bld: 106 mg/dL — ABNORMAL HIGH (ref 65–99)
Potassium: 4.5 mmol/L (ref 3.5–5.3)
Sodium: 141 mmol/L (ref 135–146)
Total Bilirubin: 0.7 mg/dL (ref 0.2–1.2)
Total Protein: 6.6 g/dL (ref 6.1–8.1)

## 2019-06-15 LAB — HEMOGLOBIN A1C
Hgb A1c MFr Bld: 5.3 % of total Hgb (ref <5.7)
Mean Plasma Glucose: 105 (calc)
eAG (mmol/L): 5.8 (calc)

## 2019-06-15 LAB — TSH: TSH: 2.59 mIU/L (ref 0.40–4.50)

## 2019-06-15 LAB — VITAMIN D 25 HYDROXY (VIT D DEFICIENCY, FRACTURES): Vit D, 25-Hydroxy: 41 ng/mL (ref 30–100)

## 2019-06-19 NOTE — Telephone Encounter (Signed)
Llesenia,   Thyroid in normal range. Vitamin D looks GREAT. A1C normal and stable. Kidney and liver look great.  Liver enzymes up a hair but better than last 2 lab draws. Whatever you are doing keep doing. Do you need refills?   -Luvenia Starch

## 2019-06-22 ENCOUNTER — Encounter: Payer: Self-pay | Admitting: Physician Assistant

## 2019-06-23 MED ORDER — SYNTHROID 112 MCG PO TABS: 112.0000 ug | ORAL_TABLET | Freq: Every day | ORAL | 3 refills | Status: DC

## 2019-07-03 ENCOUNTER — Ambulatory Visit (INDEPENDENT_AMBULATORY_CARE_PROVIDER_SITE_OTHER): Payer: BC Managed Care – PPO | Admitting: Medical-Surgical

## 2019-07-03 ENCOUNTER — Encounter: Payer: Self-pay | Admitting: Medical-Surgical

## 2019-07-03 VITALS — BP 138/87 | HR 71 | Temp 98.1°F | Ht 64.0 in | Wt 190.7 lb

## 2019-07-03 DIAGNOSIS — L02213 Cutaneous abscess of chest wall: Secondary | ICD-10-CM

## 2019-07-03 DIAGNOSIS — R0981 Nasal congestion: Secondary | ICD-10-CM | POA: Diagnosis not present

## 2019-07-03 MED ORDER — DOXYCYCLINE HYCLATE 100 MG PO TABS: 100.0000 mg | ORAL_TABLET | Freq: Two times a day (BID) | ORAL | 0 refills | Status: AC

## 2019-07-03 NOTE — Progress Notes (Addendum)
Subjective:    CC: Cyst on left side along bra line, nasal congestion  HPI: Very pleasant 59 year old female presenting today with complaints of a left side cyst along the bra line for approximately 1 week.  The cyst started out as a small bump that was tender along the left side.  Patient reports using Neosporin with no relief.  Over the past several days, she noticed that the cyst has enlarged, has become more tender, and she is now having pain along her back on the same side.  Had a previous cyst in the past under her right breast which was drained by a surgeon.  Denies fever, chills, myalgias.  Reports nasal congestion for greater than 1 week.  She originally started out with significant nasal congestion, sneezing, watery eyes, and sore throat.  Most of the symptoms have resolved but she does have continued severe nasal congestion with large amounts of yellowish/greenish mucus.  I reviewed the past medical history, family history, social history, surgical history, and allergies today and no changes were needed.  Please see the problem list section below in epic for further details.  Past Medical History: History reviewed. No pertinent past medical history. Past Surgical History: Past Surgical History:  Procedure Laterality Date  . cyst removed under Right breast  1-11  . TUBAL LIGATION     Social History: Social History   Socioeconomic History  . Marital status: Married    Spouse name: Not on file  . Number of children: Not on file  . Years of education: Not on file  . Highest education level: Not on file  Occupational History  . Not on file  Tobacco Use  . Smoking status: Former Smoker    Quit date: 01/26/1982    Years since quitting: 37.4  . Smokeless tobacco: Never Used  Substance and Sexual Activity  . Alcohol use: No  . Drug use: No  . Sexual activity: Not on file  Other Topics Concern  . Not on file  Social History Narrative  . Not on file   Social Determinants  of Health   Financial Resource Strain:   . Difficulty of Paying Living Expenses:   Food Insecurity:   . Worried About Charity fundraiser in the Last Year:   . Arboriculturist in the Last Year:   Transportation Needs:   . Film/video editor (Medical):   Marland Kitchen Lack of Transportation (Non-Medical):   Physical Activity:   . Days of Exercise per Week:   . Minutes of Exercise per Session:   Stress:   . Feeling of Stress :   Social Connections:   . Frequency of Communication with Friends and Family:   . Frequency of Social Gatherings with Friends and Family:   . Attends Religious Services:   . Active Member of Clubs or Organizations:   . Attends Archivist Meetings:   Marland Kitchen Marital Status:    Family History: Family History  Problem Relation Age of Onset  . Cancer Mother        Lung   Allergies: Allergies  Allergen Reactions  . Rofecoxib     REACTION: itch   Medications: See med rec.  Review of Systems: See HPI for pertinent positives and negatives.   Objective:    General: Well Developed, well nourished, and in no acute distress.  Neuro: Alert and oriented x3.  HEENT: Normocephalic, atraumatic, pupils equal round reactive to light, neck supple, no masses, no lymphadenopathy, thyroid nonpalpable.  No frontal or maxillary sinus tenderness. Skin: Warm and dry.  2.5 cm x 3.5 cm swelling with fluctuance and mild erythema along the left ribs directly below the axilla along the bra line.  No openings in skin or drainage. Cardiac: Regular rate and rhythm, no murmurs rubs or gallops, no lower extremity edema.  Respiratory: Clear to auscultation bilaterally. Not using accessory muscles, speaking in full sentences.   Impression and Recommendations:    1. Cutaneous abscess of chest wall Diagnosis: abscess - Location: Left chest wall along bra line Procedure: Incision & drainage Informed consent:  Discussed risks (infection, pain, bleeding, bruising, numbness, and recurrence  of the condition) and benefits of the procedure, as well as the alternatives.  Informed consent was obtained. Anesthesia: 1% xylocaine with epinephrine   The area was prepared and draped in a standard fashion. The lesion drained pus and blood. Pieces of cyst wall were extracted. Due to its large size, the cavity was packed with iodoform gauze. The patient was instructed to return in 2-3 days for packing removal. The patient tolerated the procedure well. The patient was instructed on post-op care. Sending in doxycycline 100 mg twice daily x7 days.  2.  Sinus congestion Recommend increasing hydration.  May also use OTC Mucinex.  Doxycycline for abscess should help cover for sinus concerns.  Return in about 2 days (around 07/05/2019) for abscess follow up. ___________________________________________ Clearnce Sorrel, DNP, APRN, FNP-BC [facility] [place]

## 2019-07-05 ENCOUNTER — Ambulatory Visit (INDEPENDENT_AMBULATORY_CARE_PROVIDER_SITE_OTHER): Payer: BC Managed Care – PPO | Admitting: Medical-Surgical

## 2019-07-05 ENCOUNTER — Other Ambulatory Visit: Payer: Self-pay

## 2019-07-05 ENCOUNTER — Encounter: Payer: Self-pay | Admitting: Medical-Surgical

## 2019-07-05 ENCOUNTER — Encounter: Payer: Self-pay | Admitting: Physician Assistant

## 2019-07-05 VITALS — BP 139/84 | HR 84 | Temp 97.7°F | Ht 64.0 in

## 2019-07-05 DIAGNOSIS — L02213 Cutaneous abscess of chest wall: Secondary | ICD-10-CM | POA: Diagnosis not present

## 2019-07-05 NOTE — Progress Notes (Signed)
Subjective:    CC: Abscess follow-up  HPI: Pleasant 59 year old female presenting today for follow-up from incision and drainage of left chest wall abscess performed on 07/03/2019.  Patient reports she did have some pain on Monday evening for which she took an Advil, good relief.  She is taking doxycycline as prescribed, tolerating well without side effects.  She has not removed the dressing or manipulated the packing in any way.  She currently reports no pain in the area and has no current complaints.  I reviewed the past medical history, family history, social history, surgical history, and allergies today and no changes were needed.  Please see the problem list section below in epic for further details.  Past Medical History: History reviewed. No pertinent past medical history. Past Surgical History: Past Surgical History:  Procedure Laterality Date  . cyst removed under Right breast  1-11  . TUBAL LIGATION     Social History: Social History   Socioeconomic History  . Marital status: Married    Spouse name: Not on file  . Number of children: Not on file  . Years of education: Not on file  . Highest education level: Not on file  Occupational History  . Not on file  Tobacco Use  . Smoking status: Former Smoker    Quit date: 01/26/1982    Years since quitting: 37.4  . Smokeless tobacco: Never Used  Substance and Sexual Activity  . Alcohol use: No  . Drug use: No  . Sexual activity: Not on file  Other Topics Concern  . Not on file  Social History Narrative  . Not on file   Social Determinants of Health   Financial Resource Strain:   . Difficulty of Paying Living Expenses:   Food Insecurity:   . Worried About Charity fundraiser in the Last Year:   . Arboriculturist in the Last Year:   Transportation Needs:   . Film/video editor (Medical):   Marland Kitchen Lack of Transportation (Non-Medical):   Physical Activity:   . Days of Exercise per Week:   . Minutes of Exercise per  Session:   Stress:   . Feeling of Stress :   Social Connections:   . Frequency of Communication with Friends and Family:   . Frequency of Social Gatherings with Friends and Family:   . Attends Religious Services:   . Active Member of Clubs or Organizations:   . Attends Archivist Meetings:   Marland Kitchen Marital Status:    Family History: Family History  Problem Relation Age of Onset  . Cancer Mother        Lung   Allergies: Allergies  Allergen Reactions  . Rofecoxib Itching   Medications: See med rec.  Review of Systems: See HPI for pertinent positives and negatives .   Objective:    General: Well Developed, well nourished, and in no acute distress.  Neuro: Alert and oriented x3.  HEENT: Normocephalic, atraumatic.  Skin: Warm and dry.  Dressing in place to left chest wall I&D site with moderate dry serosanguineous drainage and a small amount of new serosanguineous drainage present.  Packing in place, removed without difficulty.  Half centimeter incision site with clean edges and 0.25 cm circumscribed erythema.  No further areas of fluctuance noted and no significant drainage with manipulation of the area. Cardiac: Regular rate and rhythm, no murmurs rubs or gallops, no lower extremity edema.  Respiratory: Clear to auscultation bilaterally. Not using accessory muscles, speaking in  full sentences.  Impression and Recommendations:    1. Cutaneous abscess of chest wall Applied antibiotic ointment to the incision site and covered with a sterile gauze held in place with paper tape.  Advised patient to continue doxycycline as prescribed until course is finished.  Wash daily with soap and water.  May apply triple antibiotic ointment over the site if she desires but keep covered with a sterile gauze or Band-Aid.  If she develops fever, chills, worsening pain at the site, or increased drainage, advised patient to return for further evaluation.  Return if symptoms worsen or fail to  improve. ___________________________________________ Clearnce Sorrel, DNP, APRN, FNP-BC [facility] [place] [place]

## 2019-07-07 ENCOUNTER — Encounter: Payer: Self-pay | Admitting: Physician Assistant

## 2019-11-21 ENCOUNTER — Other Ambulatory Visit: Payer: Self-pay | Admitting: Physician Assistant

## 2020-01-31 ENCOUNTER — Encounter: Payer: Self-pay | Admitting: Physician Assistant

## 2020-02-14 DIAGNOSIS — Z20822 Contact with and (suspected) exposure to covid-19: Secondary | ICD-10-CM | POA: Diagnosis not present

## 2020-03-19 ENCOUNTER — Encounter: Payer: Self-pay | Admitting: Physician Assistant

## 2020-03-19 DIAGNOSIS — Z0000 Encounter for general adult medical examination without abnormal findings: Secondary | ICD-10-CM

## 2020-03-19 DIAGNOSIS — Z13220 Encounter for screening for lipoid disorders: Secondary | ICD-10-CM

## 2020-03-19 DIAGNOSIS — E559 Vitamin D deficiency, unspecified: Secondary | ICD-10-CM

## 2020-03-19 DIAGNOSIS — E039 Hypothyroidism, unspecified: Secondary | ICD-10-CM

## 2020-03-19 DIAGNOSIS — R7301 Impaired fasting glucose: Secondary | ICD-10-CM

## 2020-03-19 MED ORDER — VITAMIN D (ERGOCALCIFEROL) 1.25 MG (50000 UNIT) PO CAPS: 50000.0000 [IU] | ORAL_CAPSULE | ORAL | 0 refills | Status: DC

## 2020-04-02 DIAGNOSIS — Z13220 Encounter for screening for lipoid disorders: Secondary | ICD-10-CM | POA: Diagnosis not present

## 2020-04-02 DIAGNOSIS — E039 Hypothyroidism, unspecified: Secondary | ICD-10-CM | POA: Diagnosis not present

## 2020-04-02 DIAGNOSIS — R7301 Impaired fasting glucose: Secondary | ICD-10-CM | POA: Diagnosis not present

## 2020-04-02 DIAGNOSIS — E559 Vitamin D deficiency, unspecified: Secondary | ICD-10-CM | POA: Diagnosis not present

## 2020-04-03 ENCOUNTER — Other Ambulatory Visit: Payer: Self-pay

## 2020-04-03 ENCOUNTER — Encounter: Payer: Self-pay | Admitting: Physician Assistant

## 2020-04-03 ENCOUNTER — Ambulatory Visit (INDEPENDENT_AMBULATORY_CARE_PROVIDER_SITE_OTHER): Payer: BC Managed Care – PPO | Admitting: Physician Assistant

## 2020-04-03 VITALS — BP 122/86 | HR 71 | Temp 98.8°F | Resp 20 | Ht 64.0 in | Wt 195.0 lb

## 2020-04-03 DIAGNOSIS — Z1211 Encounter for screening for malignant neoplasm of colon: Secondary | ICD-10-CM

## 2020-04-03 DIAGNOSIS — Z6833 Body mass index (BMI) 33.0-33.9, adult: Secondary | ICD-10-CM

## 2020-04-03 DIAGNOSIS — E6609 Other obesity due to excess calories: Secondary | ICD-10-CM

## 2020-04-03 DIAGNOSIS — Z0000 Encounter for general adult medical examination without abnormal findings: Secondary | ICD-10-CM

## 2020-04-03 DIAGNOSIS — E782 Mixed hyperlipidemia: Secondary | ICD-10-CM | POA: Diagnosis not present

## 2020-04-03 DIAGNOSIS — E559 Vitamin D deficiency, unspecified: Secondary | ICD-10-CM | POA: Diagnosis not present

## 2020-04-03 DIAGNOSIS — E039 Hypothyroidism, unspecified: Secondary | ICD-10-CM

## 2020-04-03 LAB — COMPLETE METABOLIC PANEL WITH GFR
AG Ratio: 1.8 (calc) (ref 1.0–2.5)
ALT: 39 U/L — ABNORMAL HIGH (ref 6–29)
AST: 39 U/L — ABNORMAL HIGH (ref 10–35)
Albumin: 4.2 g/dL (ref 3.6–5.1)
Alkaline phosphatase (APISO): 92 U/L (ref 37–153)
BUN: 11 mg/dL (ref 7–25)
CO2: 25 mmol/L (ref 20–32)
Calcium: 8.7 mg/dL (ref 8.6–10.4)
Chloride: 109 mmol/L (ref 98–110)
Creat: 0.76 mg/dL (ref 0.50–0.99)
GFR, Est African American: 99 mL/min/{1.73_m2} (ref >=60)
GFR, Est Non African American: 85 mL/min/{1.73_m2} (ref >=60)
Globulin: 2.3 g/dL (calc) (ref 1.9–3.7)
Glucose, Bld: 115 mg/dL — ABNORMAL HIGH (ref 65–99)
Potassium: 4.2 mmol/L (ref 3.5–5.3)
Sodium: 143 mmol/L (ref 135–146)
Total Bilirubin: 0.6 mg/dL (ref 0.2–1.2)
Total Protein: 6.5 g/dL (ref 6.1–8.1)

## 2020-04-03 LAB — LIPID PANEL W/REFLEX DIRECT LDL
Cholesterol: 246 mg/dL — ABNORMAL HIGH (ref <200)
HDL: 81 mg/dL (ref >=50)
LDL Cholesterol (Calc): 142 mg/dL (calc) — ABNORMAL HIGH
Non-HDL Cholesterol (Calc): 165 mg/dL (calc) — ABNORMAL HIGH (ref <130)
Total CHOL/HDL Ratio: 3 (calc) (ref <5.0)
Triglycerides: 112 mg/dL (ref <150)

## 2020-04-03 LAB — HEMOGLOBIN A1C
Hgb A1c MFr Bld: 5.6 % of total Hgb (ref <5.7)
Mean Plasma Glucose: 114 mg/dL
eAG (mmol/L): 6.3 mmol/L

## 2020-04-03 LAB — TSH: TSH: 2.41 mIU/L (ref 0.40–4.50)

## 2020-04-03 LAB — VITAMIN D 25 HYDROXY (VIT D DEFICIENCY, FRACTURES): Vit D, 25-Hydroxy: 53 ng/mL (ref 30–100)

## 2020-04-03 MED ORDER — VITAMIN D (ERGOCALCIFEROL) 1.25 MG (50000 UNIT) PO CAPS: 50000.0000 [IU] | ORAL_CAPSULE | ORAL | 3 refills | Status: DC

## 2020-04-03 MED ORDER — SYNTHROID 112 MCG PO TABS: 112.0000 ug | ORAL_TABLET | Freq: Every day | ORAL | 3 refills | Status: DC

## 2020-04-03 NOTE — Progress Notes (Signed)
Subjective:     Kelly Wheeler is a 60 y.o. female and is here for a comprehensive physical exam. The patient reports no problems.  Social History   Socioeconomic History  . Marital status: Married    Spouse name: Not on file  . Number of children: Not on file  . Years of education: Not on file  . Highest education level: Not on file  Occupational History  . Not on file  Tobacco Use  . Smoking status: Former Smoker    Quit date: 01/26/1982    Years since quitting: 38.2  . Smokeless tobacco: Never Used  Substance and Sexual Activity  . Alcohol use: No  . Drug use: No  . Sexual activity: Not on file  Other Topics Concern  . Not on file  Social History Narrative  . Not on file   Social Determinants of Health   Financial Resource Strain: Not on file  Food Insecurity: Not on file  Transportation Needs: Not on file  Physical Activity: Not on file  Stress: Not on file  Social Connections: Not on file  Intimate Partner Violence: Not on file   Health Maintenance  Topic Date Due  . PAP SMEAR-Modifier  04/03/2020 (Originally 12/09/2019)  . COLONOSCOPY (Pts 45-80yrs Insurance coverage will need to be confirmed)  07/02/2020 (Originally 04/02/2005)  . HIV Screening  07/02/2020 (Originally 04/03/1975)  . MAMMOGRAM  04/12/2021  . TETANUS/TDAP  02/17/2025  . Hepatitis C Screening  Completed  . HPV VACCINES  Aged Out  . INFLUENZA VACCINE  Discontinued  . COVID-19 Vaccine  Discontinued    The following portions of the patient's history were reviewed and updated as appropriate: allergies, current medications, past family history, past medical history, past social history, past surgical history and problem list.  Review of Systems A comprehensive review of systems was negative.   Objective:    BP 122/86   Pulse 71   Temp 98.8 F (37.1 C) (Oral)   Resp 20   Ht 5\' 4"  (1.626 m)   Wt 195 lb (88.5 kg)   LMP 05/09/2010   SpO2 100%   BMI 33.47 kg/m  General appearance: alert,  cooperative, appears stated age and mildly obese Head: Normocephalic, without obvious abnormality, atraumatic Eyes: conjunctivae/corneas clear. PERRL, EOM's intact. Fundi benign. Ears: normal TM's and external ear canals both ears Nose: Nares normal. Septum midline. Mucosa normal. No drainage or sinus tenderness. Throat: lips, mucosa, and tongue normal; teeth and gums normal Neck: no adenopathy, no carotid bruit, no JVD, supple, symmetrical, trachea midline and thyroid not enlarged, symmetric, no tenderness/mass/nodules Back: symmetric, no curvature. ROM normal. No CVA tenderness. Lungs: clear to auscultation bilaterally Heart: regular rate and rhythm, S1, S2 normal, no murmur, click, rub or gallop Abdomen: soft, non-tender; bowel sounds normal; no masses,  no organomegaly Extremities: extremities normal, atraumatic, no cyanosis or edema Pulses: 2+ and symmetric Skin: Skin color, texture, turgor normal. No rashes or lesions Lymph nodes: Cervical, supraclavicular, and axillary nodes normal. Neurologic: Alert and oriented X 3, normal strength and tone. Normal symmetric reflexes. Normal coordination and gait   .Marland Kitchen Depression screen [facility] 2/9 04/03/2020 04/15/2018 09/24/2017  Decreased Interest 0 0 0  Down, Depressed, Hopeless 0 0 0  PHQ - 2 Score 0 0 0  Altered sleeping 0 1 1  Tired, decreased energy 0 0 1  Change in appetite 0 2 1  Feeling bad or failure about yourself  0 0 0  Trouble concentrating 0 0 0  Moving  slowly or fidgety/restless 0 0 0  Suicidal thoughts 0 0 0  PHQ-9 Score 0 3 3  Difficult doing work/chores Not difficult at all Not difficult at all Not difficult at all   .Marland Kitchen GAD 7 : Generalized Anxiety Score 04/03/2020 04/15/2018 09/24/2017  Nervous, Anxious, on Edge 0 0 0  Control/stop worrying 0 0 0  Worry too much - different things 0 0 0  Trouble relaxing 0 1 0  Restless 0 0 0  Easily annoyed or irritable 0 1 0  Afraid - awful might happen 0 0 1  Total GAD 7 Score 0 2 1   Anxiety Difficulty Not difficult at all Not difficult at all Not difficult at all     Assessment:    Healthy female exam.      Plan:    Marland KitchenMarland KitchenDiagnoses and all orders for this visit:  Routine physical examination -     Ambulatory referral to Gastroenterology  Colon cancer screening -     Ambulatory referral to Gastroenterology  Mixed hyperlipidemia  Vitamin D deficiency -     Vitamin D, Ergocalciferol, (DRISDOL) 1.25 MG (50000 UNIT) CAPS capsule; Take 1 capsule (50,000 Units total) by mouth every 7 (seven) days.  Acquired hypothyroidism -     SYNTHROID 112 MCG tablet; Take 1 tablet (112 mcg total) by mouth daily before breakfast.  Class 1 obesity due to excess calories without serious comorbidity with body mass index (BMI) of 33.0 to 33.9 in adult   .Marland Kitchen Discussed 150 minutes of exercise a week.  Encouraged vitamin D 1000 units and Calcium 1300mg  or 4 servings of dairy a day.  PHQ/GAD wonderful.  Reviewed fasting labs.  3.6 10 year cardiac risk. Dicussed lifestyle changes.  Pap to be requested at [employer] [employer]. Mammogram 2021. She is supposed to go this month.  Colonoscopy ordered.  Declined flu/covid/shingles.  Refilled levothyroxine/vitamin d.   Marland Kitchen.Discussed low carb diet with 1500 calories and 80g of protein.  Exercising at least 150 minutes a week.  My Fitness Pal could be a Microbiologist.   See After Visit Summary for Counseling Recommendations

## 2020-04-03 NOTE — Patient Instructions (Signed)
Health Maintenance, Female Adopting a healthy lifestyle and getting preventive care are important in promoting health and wellness. Ask your health care provider about:  The right schedule for you to have regular tests and exams.  Things you can do on your own to prevent diseases and keep yourself healthy. What should I know about diet, weight, and exercise? Eat a healthy diet  Eat a diet that includes plenty of vegetables, fruits, low-fat dairy products, and lean protein.  Do not eat a lot of foods that are high in solid fats, added sugars, or sodium.   Maintain a healthy weight Body mass index (BMI) is used to identify weight problems. It estimates body fat based on height and weight. Your health care provider can help determine your BMI and help you achieve or maintain a healthy weight. Get regular exercise Get regular exercise. This is one of the most important things you can do for your health. Most adults should:  Exercise for at least 150 minutes each week. The exercise should increase your heart rate and make you sweat (moderate-intensity exercise).  Do strengthening exercises at least twice a week. This is in addition to the moderate-intensity exercise.  Spend less time sitting. Even light physical activity can be beneficial. Watch cholesterol and blood lipids Have your blood tested for lipids and cholesterol at 60 years of age, then have this test every 5 years. Have your cholesterol levels checked more often if:  Your lipid or cholesterol levels are high.  You are older than 60 years of age.  You are at high risk for heart disease. What should I know about cancer screening? Depending on your health history and family history, you may need to have cancer screening at various ages. This may include screening for:  Breast cancer.  Cervical cancer.  Colorectal cancer.  Skin cancer.  Lung cancer. What should I know about heart disease, diabetes, and high blood  pressure? Blood pressure and heart disease  High blood pressure causes heart disease and increases the risk of stroke. This is more likely to develop in people who have high blood pressure readings, are of African descent, or are overweight.  Have your blood pressure checked: ? Every 3-5 years if you are 18-39 years of age. ? Every year if you are 40 years old or older. Diabetes Have regular diabetes screenings. This checks your fasting blood sugar level. Have the screening done:  Once every three years after age 40 if you are at a normal weight and have a low risk for diabetes.  More often and at a younger age if you are overweight or have a high risk for diabetes. What should I know about preventing infection? Hepatitis B If you have a higher risk for hepatitis B, you should be screened for this virus. Talk with your health care provider to find out if you are at risk for hepatitis B infection. Hepatitis C Testing is recommended for:  Everyone born from 1945 through 1965.  Anyone with known risk factors for hepatitis C. Sexually transmitted infections (STIs)  Get screened for STIs, including gonorrhea and chlamydia, if: ? You are sexually active and are younger than 60 years of age. ? You are older than 60 years of age and your health care provider tells you that you are at risk for this type of infection. ? Your sexual activity has changed since you were last screened, and you are at increased risk for chlamydia or gonorrhea. Ask your health care provider   if you are at risk.  Ask your health care provider about whether you are at high risk for HIV. Your health care provider may recommend a prescription medicine to help prevent HIV infection. If you choose to take medicine to prevent HIV, you should first get tested for HIV. You should then be tested every 3 months for as long as you are taking the medicine. Pregnancy  If you are about to stop having your period (premenopausal) and  you may become pregnant, seek counseling before you get pregnant.  Take 400 to 800 micrograms (mcg) of folic acid every day if you become pregnant.  Ask for birth control (contraception) if you want to prevent pregnancy. Osteoporosis and menopause Osteoporosis is a disease in which the bones lose minerals and strength with aging. This can result in bone fractures. If you are 65 years old or older, or if you are at risk for osteoporosis and fractures, ask your health care provider if you should:  Be screened for bone loss.  Take a calcium or vitamin D supplement to lower your risk of fractures.  Be given hormone replacement therapy (HRT) to treat symptoms of menopause. Follow these instructions at home: Lifestyle  Do not use any products that contain nicotine or tobacco, such as cigarettes, e-cigarettes, and chewing tobacco. If you need help quitting, ask your health care provider.  Do not use street drugs.  Do not share needles.  Ask your health care provider for help if you need support or information about quitting drugs. Alcohol use  Do not drink alcohol if: ? Your health care provider tells you not to drink. ? You are pregnant, may be pregnant, or are planning to become pregnant.  If you drink alcohol: ? Limit how much you use to 0-1 drink a day. ? Limit intake if you are breastfeeding.  Be aware of how much alcohol is in your drink. In the [place] one drink equals one 12 oz bottle of beer (355 mL), one 5 oz glass of wine (148 mL), or one 1 oz glass of hard liquor (44 mL). General instructions  Schedule regular health, dental, and eye exams.  Stay current with your vaccines.  Tell your health care provider if: ? You often feel depressed. ? You have ever been abused or do not feel safe at home. Summary  Adopting a healthy lifestyle and getting preventive care are important in promoting health and wellness.  Follow your health care provider's instructions about healthy  diet, exercising, and getting tested or screened for diseases.  Follow your health care provider's instructions on monitoring your cholesterol and blood pressure. This information is not intended to replace advice given to you by your health care provider. Make sure you discuss any questions you have with your health care provider. Document Revised: 01/05/2018 Document Reviewed: 01/05/2018 Elsevier Patient Education  2021 [employer]  

## 2020-04-03 NOTE — Telephone Encounter (Signed)
Will discuss at CPE today.

## 2020-04-12 ENCOUNTER — Encounter: Payer: Self-pay | Admitting: Physician Assistant

## 2020-04-12 DIAGNOSIS — Z1211 Encounter for screening for malignant neoplasm of colon: Secondary | ICD-10-CM

## 2020-04-18 DIAGNOSIS — R030 Elevated blood-pressure reading, without diagnosis of hypertension: Secondary | ICD-10-CM | POA: Diagnosis not present

## 2020-04-18 DIAGNOSIS — Z6833 Body mass index (BMI) 33.0-33.9, adult: Secondary | ICD-10-CM | POA: Diagnosis not present

## 2020-04-18 DIAGNOSIS — Z780 Asymptomatic menopausal state: Secondary | ICD-10-CM | POA: Diagnosis not present

## 2020-04-18 DIAGNOSIS — Z130 Encounter for screening for diseases of the blood and blood-forming organs and certain disorders involving the immune mechanism: Secondary | ICD-10-CM | POA: Diagnosis not present

## 2020-04-18 DIAGNOSIS — Z01419 Encounter for gynecological examination (general) (routine) without abnormal findings: Secondary | ICD-10-CM | POA: Diagnosis not present

## 2020-04-18 DIAGNOSIS — Z1231 Encounter for screening mammogram for malignant neoplasm of breast: Secondary | ICD-10-CM | POA: Diagnosis not present

## 2020-04-19 DIAGNOSIS — Z1151 Encounter for screening for human papillomavirus (HPV): Secondary | ICD-10-CM | POA: Diagnosis not present

## 2020-04-19 DIAGNOSIS — Z124 Encounter for screening for malignant neoplasm of cervix: Secondary | ICD-10-CM | POA: Diagnosis not present

## 2020-04-25 LAB — HM PAP SMEAR

## 2020-08-08 ENCOUNTER — Encounter: Payer: Self-pay | Admitting: Physician Assistant

## 2020-08-16 ENCOUNTER — Encounter: Payer: Self-pay | Admitting: Physician Assistant

## 2020-08-16 ENCOUNTER — Telehealth (INDEPENDENT_AMBULATORY_CARE_PROVIDER_SITE_OTHER): Payer: BC Managed Care – PPO | Admitting: Physician Assistant

## 2020-08-16 VITALS — Temp 97.7°F | Ht 64.0 in | Wt 197.0 lb

## 2020-08-16 DIAGNOSIS — R059 Cough, unspecified: Secondary | ICD-10-CM

## 2020-08-16 DIAGNOSIS — J019 Acute sinusitis, unspecified: Secondary | ICD-10-CM

## 2020-08-16 MED ORDER — DOXYCYCLINE HYCLATE 100 MG PO TABS: 100.0000 mg | ORAL_TABLET | Freq: Two times a day (BID) | ORAL | 0 refills | Status: AC

## 2020-08-16 MED ORDER — PSEUDOEPHEDRINE-GUAIFENESIN ER 60-600 MG PO TB12: 1.0000 | ORAL_TABLET | Freq: Two times a day (BID) | ORAL | Status: DC

## 2020-08-16 NOTE — Progress Notes (Signed)
Telemedicine Visit via  Video & Audio (App used: Telephone) Audio only - telephone (patient preference /  technical difficulty with MyChart video application)  I connected with JERALYN FASCHING on 08/16/20 at 9:02 AM  by phone or  telemedicine application as noted above  I verified that I am speaking with or regarding  the correct patient using two identifiers.  Participants: Myself, Nelson Chimes PA-C Patient: Kelly Wheeler   Patient is at home in [facility] I am in office at [facility]    I discussed the limitations of evaluation and management  by telemedicine and the availability of in person appointments.  The participant(s) above expressed understanding and  agreed to proceed with this appointment via telemedicine.       History of Present Illness: Kelly Wheeler is a 60 y.o. female who would like to discuss cough and congestion x 10 days  Started with fever, but resolved and has been afebrile for x 1 week Symptoms are gradually improving Reports mucus from nose turned green and she is having sinus pressure and sinus headaches - taking Tylenol/Advil as needed Cough is occasionally productive of clear sputum Denies wheezing, SOB/DOE, chest pain, blood-tinged sputum, dizziness, syncope    Observations/Objective: Temp 97.7 F (36.5 C)   Ht '5\' 4"'$  (1.626 m)   Wt 197 lb (89.4 kg)   LMP 05/09/2010   BMI 33.81 kg/m  BP Readings from Last 3 Encounters:  04/03/20 122/86  07/05/19 139/84  07/03/19 138/87   Exam limited by telephone Alert and cooperative Normal Speech.  Normal thought content  Lab and Radiology Results No results found for this or any previous visit (from the past 72 hour(s)). No results found.     Assessment and Plan: 60 y.o. female with The primary encounter diagnosis was Acute non-recurrent sinusitis, unspecified location. A diagnosis of Cough was also pertinent to this visit.   PDMP not reviewed this encounter. No orders of the  defined types were placed in this encounter.  Meds ordered this encounter  Medications   doxycycline (VIBRA-TABS) 100 MG tablet    Sig: Take 1 tablet (100 mg total) by mouth 2 (two) times daily for 7 days.    Dispense:  14 tablet    Refill:  0    Order Specific Question:   Supervising Provider    Answer:   Emeterio Reeve [id number]   pseudoephedrine-guaifenesin (MUCINEX D) 60-600 MG 12 hr tablet    Sig: Take 1 tablet by mouth every 12 (twelve) hours.    Order Specific Question:   Supervising Provider    Answer:   Emeterio Reeve [id number]   Patient Instructions  Sinusitis, Adult Sinusitis is soreness and swelling (inflammation) of your sinuses. Sinuses are hollow spaces in the bones around your face. They are located: Around your eyes. In the middle of your forehead. Behind your nose. In your cheekbones. Your sinuses and nasal passages are lined with a fluid called mucus. Mucus drains out of your sinuses. Swelling can trap mucus in your sinuses. This lets germs (bacteria, virus, or fungus) grow, which leads to infection. Most of the time, this condition is caused bya virus. What are the causes? This condition is caused by: Allergies. Asthma. Germs. Things that block your nose or sinuses. Growths in the nose (nasal polyps). Chemicals or irritants in the air. Fungus (rare). What increases the risk? You are more likely to develop this condition if: You have a weak body defense system (immune system). You do a lot  of swimming or diving. You use nasal sprays too much. You smoke. What are the signs or symptoms? The main symptoms of this condition are pain and a feeling of pressure around the sinuses. Other symptoms include: Stuffy nose (congestion). Runny nose (drainage). Swelling and warmth in the sinuses. Headache. Toothache. A cough that may get worse at night. Mucus that collects in the throat or the back of the nose (postnasal drip). Being unable to smell and  taste. Being very tired (fatigue). A fever. Sore throat. Bad breath. How is this diagnosed? This condition is diagnosed based on: Your symptoms. Your medical history. A physical exam. Tests to find out if your condition is short-term (acute) or long-term (chronic). Your doctor may: Check your nose for growths (polyps). Check your sinuses using a tool that has a light (endoscope). Check for allergies or germs. Do imaging tests, such as an MRI or CT scan. How is this treated? Treatment for this condition depends on the cause and whether it is short-term or long-term. If caused by a virus, your symptoms should go away on their own within 10 days. You may be given medicines to relieve symptoms. They include: Medicines that shrink swollen tissue in the nose. Medicines that treat allergies (antihistamines). A spray that treats swelling of the nostrils.  Rinses that help get rid of thick mucus in your nose (nasal saline washes). If caused by bacteria, your doctor may wait to see if you will get better without treatment. You may be given antibiotic medicine if you have: A very bad infection. A weak body defense system. If caused by growths in the nose, you may need to have surgery. Follow these instructions at home: Medicines Take, use, or apply over-the-counter and prescription medicines only as told by your doctor. These may include nasal sprays. If you were prescribed an antibiotic medicine, take it as told by your doctor. Do not stop taking the antibiotic even if you start to feel better. Hydrate and humidify  Drink enough water to keep your pee (urine) pale yellow. Use a cool mist humidifier to keep the humidity level in your home above 50%. Breathe in steam for 10-15 minutes, 3-4 times a day, or as told by your doctor. You can do this in the bathroom while a hot shower is running. Try not to spend time in cool or dry air.  Rest Rest as much as you can. Sleep with your head raised  (elevated). Make sure you get enough sleep each night. General instructions  Put a warm, moist washcloth on your face 3-4 times a day, or as often as told by your doctor. This will help with discomfort. Wash your hands often with soap and water. If there is no soap and water, use hand sanitizer. Do not smoke. Avoid being around people who are smoking (secondhand smoke). Keep all follow-up visits as told by your doctor. This is important.  Contact a doctor if: You have a fever. Your symptoms get worse. Your symptoms do not get better within 10 days. Get help right away if: You have a very bad headache. You cannot stop throwing up (vomiting). You have very bad pain or swelling around your face or eyes. You have trouble seeing. You feel confused. Your neck is stiff. You have trouble breathing. Summary Sinusitis is swelling of your sinuses. Sinuses are hollow spaces in the bones around your face. This condition is caused by tissues in your nose that become inflamed or swollen. This traps germs. These can  lead to infection. If you were prescribed an antibiotic medicine, take it as told by your doctor. Do not stop taking it even if you start to feel better. Keep all follow-up visits as told by your doctor. This is important. This information is not intended to replace advice given to you by your health care provider. Make sure you discuss any questions you have with your healthcare provider. Document Revised: 06/14/2017 Document Reviewed: 06/14/2017 Elsevier Patient Education  [place].  Instructions sent via [employer].   Follow Up Instructions: No follow-ups on file.    I discussed the assessment and treatment plan with the patient. The patient was provided an opportunity to ask questions and all were answered. The patient agreed with the plan and demonstrated an understanding of the instructions.   The patient was advised to call back or seek an in-person evaluation if any  new concerns, if symptoms worsen or if the condition fails to improve as anticipated.  10 minutes of non-face-to-face time was provided during this encounter.      . . . . . . . . . . . . . Marland Kitchen                   Historical information moved to improve visibility of documentation.  History reviewed. No pertinent past medical history. Past Surgical History:  Procedure Laterality Date   cyst removed under Right breast  1-11   TUBAL LIGATION     Social History   Tobacco Use   Smoking status: Former    Types: Cigarettes    Quit date: 01/26/1982    Years since quitting: 38.5   Smokeless tobacco: Never  Substance Use Topics   Alcohol use: No   family history includes Cancer in her mother.  Medications: Current Outpatient Medications  Medication Sig Dispense Refill   doxycycline (VIBRA-TABS) 100 MG tablet Take 1 tablet (100 mg total) by mouth 2 (two) times daily for 7 days. 14 tablet 0   pseudoephedrine-guaifenesin (MUCINEX D) 60-600 MG 12 hr tablet Take 1 tablet by mouth every 12 (twelve) hours.     SYNTHROID 112 MCG tablet Take 1 tablet (112 mcg total) by mouth daily before breakfast. 90 tablet 3   Vitamin D, Ergocalciferol, (DRISDOL) 1.25 MG (50000 UNIT) CAPS capsule Take 1 capsule (50,000 Units total) by mouth every 7 (seven) days. 12 capsule 3   No current facility-administered medications for this visit.   Allergies  Allergen Reactions   Rofecoxib Itching     If phone visit, billing and coding can please add appropriate modifier if needed

## 2020-08-16 NOTE — Patient Instructions (Signed)

## 2020-08-16 NOTE — Progress Notes (Signed)
Started 10 days ago Chills Fever Headache/congestion cough Out of work last week  No fever since last Thursday Lingering symptoms of congestion/cough at night  Mucous now green  Only OTC meds for symptoms - advil/tylenol   Did not take a Covid test

## 2020-08-21 ENCOUNTER — Encounter: Payer: Self-pay | Admitting: Physician Assistant

## 2020-08-21 NOTE — Telephone Encounter (Signed)
[place] for jury duty note for previous knee injury that led to chronic knee pain and ambulation issues.

## 2020-10-23 DIAGNOSIS — H5213 Myopia, bilateral: Secondary | ICD-10-CM | POA: Diagnosis not present

## 2021-01-16 ENCOUNTER — Encounter: Payer: Self-pay | Admitting: Physician Assistant

## 2021-04-15 ENCOUNTER — Other Ambulatory Visit: Payer: Self-pay | Admitting: Physician Assistant

## 2021-04-15 DIAGNOSIS — E039 Hypothyroidism, unspecified: Secondary | ICD-10-CM

## 2021-05-13 ENCOUNTER — Encounter: Payer: BC Managed Care – PPO | Admitting: Physician Assistant

## 2021-05-19 DIAGNOSIS — Z8742 Personal history of other diseases of the female genital tract: Secondary | ICD-10-CM | POA: Insufficient documentation

## 2021-05-20 ENCOUNTER — Ambulatory Visit (INDEPENDENT_AMBULATORY_CARE_PROVIDER_SITE_OTHER): Payer: BC Managed Care – PPO | Admitting: Physician Assistant

## 2021-05-20 ENCOUNTER — Encounter: Payer: Self-pay | Admitting: Physician Assistant

## 2021-05-20 VITALS — BP 147/87 | HR 68 | Ht 64.0 in | Wt 195.0 lb

## 2021-05-20 DIAGNOSIS — Z0000 Encounter for general adult medical examination without abnormal findings: Secondary | ICD-10-CM | POA: Diagnosis not present

## 2021-05-20 DIAGNOSIS — E782 Mixed hyperlipidemia: Secondary | ICD-10-CM | POA: Diagnosis not present

## 2021-05-20 DIAGNOSIS — R7301 Impaired fasting glucose: Secondary | ICD-10-CM | POA: Diagnosis not present

## 2021-05-20 DIAGNOSIS — E559 Vitamin D deficiency, unspecified: Secondary | ICD-10-CM | POA: Diagnosis not present

## 2021-05-20 DIAGNOSIS — E039 Hypothyroidism, unspecified: Secondary | ICD-10-CM | POA: Diagnosis not present

## 2021-05-20 DIAGNOSIS — R030 Elevated blood-pressure reading, without diagnosis of hypertension: Secondary | ICD-10-CM

## 2021-05-20 DIAGNOSIS — G479 Sleep disorder, unspecified: Secondary | ICD-10-CM

## 2021-05-20 DIAGNOSIS — Z1211 Encounter for screening for malignant neoplasm of colon: Secondary | ICD-10-CM

## 2021-05-20 MED ORDER — VITAMIN D (ERGOCALCIFEROL) 1.25 MG (50000 UNIT) PO CAPS: 50000.0000 [IU] | ORAL_CAPSULE | ORAL | 3 refills | Status: DC

## 2021-05-20 MED ORDER — SYNTHROID 112 MCG PO TABS: 112.0000 ug | ORAL_TABLET | Freq: Every day | ORAL | 11 refills | Status: DC

## 2021-05-20 NOTE — Progress Notes (Signed)
? ?Complete physical exam ? ?Patient: Kelly Wheeler   DOB: 1960-03-11   61 y.o. Female  MRN: [id number] ? ?Subjective:  ?  ?Chief Complaint  ?Patient presents with  ? Annual Exam  ? ? ?Kelly Wheeler is a 61 y.o. female who presents today for a complete physical exam. She reports consuming a general diet.  She does try to walk for exercise a few times a week.  She generally feels fairly well. She reports sleeping fairly well. She does have additional problems to discuss today. She is struggling with sleep from time to time. She has not tried medication to help.  ? ? ?Most recent fall risk assessment: ? ?  05/20/2021  ?  2:01 PM  ?Fall Risk   ?Falls in the past year? 0  ?Number falls in past yr: 0  ?Injury with Fall? 0  ?Risk for fall due to : No Fall Risks  ?Follow up Falls evaluation completed  ? ?  ?Most recent depression screenings: ? ?  05/20/2021  ?  2:01 PM 04/03/2020  ?  1:52 PM  ?PHQ 2/9 Scores  ?PHQ - 2 Score 0 0  ?PHQ- 9 Score  0  ? ? ?Vision:Within last year and Dental: No current dental problems and Receives regular dental care ? ?Patient Active Problem List  ? Diagnosis Date Noted  ? Elevated LDL cholesterol level 05/21/2021  ? Elevated blood pressure reading 05/20/2021  ? History of abnormal cervical Papanicolaou smear 05/19/2021  ? Vitamin D deficiency 11/07/2018  ? Sinus drainage 04/15/2018  ? Seasonal allergies 04/15/2018  ? Class 1 obesity due to excess calories without serious comorbidity with body mass index (BMI) of 33.0 to 33.9 in adult 09/25/2017  ? Hot flashes 01/02/2017  ? B12 deficiency 01/02/2017  ? Overweight (BMI 25.0-29.9) 01/01/2017  ? Anterior cruciate ligament complete tear, left, initial encounter 12/17/2015  ? Elevated fasting glucose 02/19/2015  ? Hyperlipidemia 03/28/2014  ? Elevated liver enzymes 03/28/2014  ? Menopausal symptoms 09/12/2013  ? Hypothyroidism 12/09/2009  ? ?History reviewed. No pertinent past medical history. ?Past Surgical History:  ?Procedure Laterality Date  ? cyst  removed under Right breast  1-11  ? TUBAL LIGATION    ? ?Family History  ?Problem Relation Age of Onset  ? Cancer Mother   ?     Lung  ? ?Allergies  ?Allergen Reactions  ? Rofecoxib Itching  ? ?  ? ?Patient Care Team: ?Lavada Mesi as PCP - General (Family Medicine) ?Paula Compton, MD as Attending Physician (Obstetrics and Gynecology)  ? ?Outpatient Medications Prior to Visit  ?Medication Sig  ? [DISCONTINUED] SYNTHROID 112 MCG tablet Take 1 tablet (112 mcg total) by mouth daily before breakfast. NEEDS LABS  ? [DISCONTINUED] Vitamin D, Ergocalciferol, (DRISDOL) 1.25 MG (50000 UNIT) CAPS capsule Take 1 capsule (50,000 Units total) by mouth every 7 (seven) days.  ? [DISCONTINUED] pseudoephedrine-guaifenesin (MUCINEX D) 60-600 MG 12 hr tablet Take 1 tablet by mouth every 12 (twelve) hours.  ? ?No facility-administered medications prior to visit.  ? ? ?Review of Systems  ?All other systems reviewed and are negative. ? ? ? ? ?   ?Objective:  ? ?  ?BP (!) 142/62   Pulse 70   Ht '5\' 4"'$  (1.626 m)   Wt 195 lb (88.5 kg)   LMP 05/09/2010   SpO2 98%   BMI 33.47 kg/m?  ?BP Readings from Last 3 Encounters:  ?05/20/21 (!) 147/87  ?04/03/20 122/86  ?07/05/19 139/84  ? ?  Wt Readings from Last 3 Encounters:  ?05/20/21 195 lb (88.5 kg)  ?08/16/20 197 lb (89.4 kg)  ?04/03/20 195 lb (88.5 kg)  ? ?  ? ?Physical Exam  ? ? ?BP (!) 147/87   Pulse 68   Ht '5\' 4"'$  (1.626 m)   Wt 195 lb (88.5 kg)   LMP 05/09/2010   SpO2 98%   BMI 33.47 kg/m?  ? ?General Appearance:    Alert, cooperative, no distress, appears stated age  ?Head:    Normocephalic, without obvious abnormality, atraumatic  ?Eyes:    PERRL, conjunctiva/corneas clear, EOM's intact, fundi  ?  benign, both eyes  ?Ears:    Normal TM's and external ear canals, both ears  ?Nose:   Nares normal, septum midline, mucosa normal, no drainage    or sinus tenderness  ?Throat:   Lips, mucosa, and tongue normal; teeth and gums normal  ?Neck:   Supple, symmetrical, trachea  midline, no adenopathy;  ?  thyroid:  no enlargement/tenderness/nodules; no carotid ?  bruit or JVD  ?Back:     Symmetric, no curvature, ROM normal, no CVA tenderness  ?Lungs:     Clear to auscultation bilaterally, respirations unlabored  ?Chest Wall:    No tenderness or deformity  ? Heart:    Regular rate and rhythm, S1 and S2 normal, no murmur, rub   or gallop  ?   ?Abdomen:     Soft, non-tender, bowel sounds active all four quadrants,  ?  no masses, no organomegaly  ?   ?   ?Extremities:   Extremities normal, atraumatic, no cyanosis or edema  ?Pulses:   2+ and symmetric all extremities  ?Skin:   Skin color, texture, turgor normal, no rashes or lesions  ?Lymph nodes:   Cervical, supraclavicular, and axillary nodes normal  ?Neurologic:   CNII-XII intact, normal strength, sensation and reflexes  ?  throughout  ? ?  .. ? ?  05/20/2021  ?  2:01 PM 04/03/2020  ?  1:52 PM 04/15/2018  ? 11:08 AM 09/24/2017  ?  4:02 PM  ?Depression screen PHQ 2/9  ?Decreased Interest 0 0 0 0  ?Down, Depressed, Hopeless 0 0 0 0  ?PHQ - 2 Score 0 0 0 0  ?Altered sleeping  0 1 1  ?Tired, decreased energy  0 0 1  ?Change in appetite  0 2 1  ?Feeling bad or failure about yourself   0 0 0  ?Trouble concentrating  0 0 0  ?Moving slowly or fidgety/restless  0 0 0  ?Suicidal thoughts  0 0 0  ?PHQ-9 Score  0 3 3  ?Difficult doing work/chores  Not difficult at all Not difficult at all Not difficult at all  ? ? ?Assessment & Plan:  ?  ?Routine Health Maintenance and Physical Exam ? ?Immunization History  ?Administered Date(s) Administered  ? Tdap 02/18/2015  ? ? ?Health Maintenance  ?Topic Date Due  ? COLONOSCOPY (Pts 45-4yr Insurance coverage will need to be confirmed)  Never done  ? PAP SMEAR-Modifier  05/20/2021 (Originally 12/09/2019)  ? Zoster Vaccines- Shingrix (1 of 2) 08/19/2021 (Originally 04/03/1979)  ? MAMMOGRAM  05/21/2022 (Originally 04/12/2021)  ? HIV Screening  05/21/2022 (Originally 04/03/1975)  ? TETANUS/TDAP  02/17/2025  ? Hepatitis C  Screening  Completed  ? HPV VACCINES  Aged Out  ? INFLUENZA VACCINE  Discontinued  ? COVID-19 Vaccine  Discontinued  ? ? ?Discussed health benefits of physical activity, and encouraged her to engage in regular exercise appropriate  for her age and condition. ? ?.. ?Discussed 150 minutes of exercise a week.  ?Encouraged vitamin D 1000 units and Calcium '1300mg'$  or 4 servings of dairy a day.  ?Fasting labs ordered ?Phq-7 no concerns ?Colonoscopy ordered ?Mammogram/pap to be done by GYN ?Declined shingles/covid/flu ? ?BP elevated today ?Discussed lifestyle management with low salt diet and regular exercise ?Recheck in 3 months ? ?Use calm aid/unisom for help with sleep at night ? ?Iran Planas, PA-C ? ? ?

## 2021-05-20 NOTE — Patient Instructions (Addendum)
Calm aid lavendar ? ?Preventing Hypertension ?Hypertension, also called high blood pressure, is when the force of blood pumping through the arteries is too strong. Arteries are blood vessels that carry blood from the heart throughout the body. Often, hypertension does not cause symptoms until blood pressure is very high. It is important to have your blood pressure checked regularly. ?Diet and lifestyle changes can help you prevent hypertension, and they may make you feel better overall and improve your quality of life. If you already have hypertension, you may control it with diet and lifestyle changes, as well as with medicine. ?How can this condition affect me? ?Over time, hypertension can damage the arteries and decrease blood flow to important parts of the body, including the brain, heart, and kidneys. By keeping your blood pressure in a healthy range, you can help prevent complications like heart attack, heart failure, stroke, kidney failure, and vascular dementia. ?What can increase my risk? ?An unhealthy diet and a lack of physical activity can make you more likely to develop high blood pressure. Some other risk factors include: ?Age. The risk increases with age. ?Having family members who have had high blood pressure. ?Having certain health conditions, such as thyroid problems. ?Being overweight or obese. ?Drinking too much alcohol or caffeine. ?Having too much fat, sugar, calories, or salt (sodium) in your diet. ?Smoking or using illegal drugs. ?Taking certain medicines, such as antidepressants, decongestants, birth control pills, and NSAIDs, such as ibuprofen. ?What actions can I take to prevent or manage this condition? ?Work with your health care provider to make a hypertension prevention plan that works for you. You may be referred for counseling on a healthy diet and physical activity. Follow your plan and keep all follow-up visits. ?Diet changes ?Maintain a healthy diet. This includes: ?Eating less  salt (sodium). Ask your health care provider how much sodium is safe for you to have. The general recommendation is to have less than 1 tsp (2,300 mg) of sodium a day. ?Do not add salt to your food. ?Choose low-sodium options when grocery shopping and eating out. ?Limiting fats in your diet. You can do this by eating low-fat or fat-free dairy products and by eating less red meat. ?Eating more fruits, vegetables, and whole grains. Make a goal to eat: ?1?-2 cups of fresh fruits and vegetables each day. ?3-4 servings of whole grains each day. ?Avoiding foods and beverages that have added sugars. ?Eating fish that contain healthy fats (omega-3 fatty acids), such as mackerel or salmon. ?If you need help putting together a healthy eating plan, try the DASH diet. This diet is high in fruits, vegetables, and whole grains. It is low in sodium, red meat, and added sugars. DASH stands for Dietary Approaches to Stop Hypertension. ?Lifestyle changes ? ?Lose weight if you are overweight. Losing just 3-5% of your body weight can help prevent or control hypertension. For example, if your present weight is 200 lb (91 kg), a loss of 3-5% of your weight means losing 6-10 lb (2.7-4.5 kg). Ask your health care provider to help you with a diet and exercise plan to safely lose weight. ?Get enough exercise. Do at least 150 minutes of moderate-intensity exercise each week. You could do this in short exercise sessions several times a day, or you could do longer exercise sessions a few times a week. For example, you could take a brisk 10-minute walk or bike ride, 3 times a day, for 5 days a week. ?Find ways to reduce stress, such as  exercising, meditating, listening to music, or taking a yoga class. If you need help reducing stress, ask your health care provider. ?Do not use any products that contain nicotine or tobacco. These products include cigarettes, chewing tobacco, and vaping devices, such as e-cigarettes. Chemicals in tobacco and  nicotine products raise your blood pressure each time you use them. If you need help quitting, ask your health care provider. ?Learn how to check your blood pressure at home. Make sure that you know your personal target blood pressure, as told by your health care provider. ?Try to sleep 7-9 hours per night. ?Alcohol use ?Do not drink alcohol if: ?Your health care provider tells you not to drink. ?You are pregnant, may be pregnant, or are planning to become pregnant. ?If you drink alcohol: ?Limit how much you have to: ?0-1 drink a day for women. ?0-2 drinks a day for men. ?Know how much alcohol is in your drink. In the [place] one drink equals one 12 oz bottle of beer (355 mL), one 5 oz glass of wine (148 mL), or one 1? oz glass of hard liquor (44 mL). ?Medicines ?In addition to diet and lifestyle changes, your health care provider may recommend medicines to help lower your blood pressure. In general: ?You may need to try a few different medicines to find what works best for you. ?You may need to take more than one medicine. ?Take over-the-counter and prescription medicines only as told by your health care provider. ?Questions to ask your health care provider ?What is my blood pressure goal? ?How can I lower my risk for high blood pressure? ?How should I monitor my blood pressure at home? ?Where to find support ?Your health care provider can help you prevent hypertension and help you keep your blood pressure at a healthy level. Your local hospital or your community may also provide support services and prevention programs. ?The American Heart Association offers an online support network at supportnetwork.heart.org ?Where to find more information ?Learn more about hypertension from: ?National Heart, Lung, and Blood Institute: https://wilson-eaton.com/ ?Centers for Disease Control and Prevention: http://www.wolf.info/ ?American Academy of Family Physicians: familydoctor.org ?Learn more about the DASH diet from: ?National Heart, Lung, and  Blood Institute: https://wilson-eaton.com/ ?Contact a health care provider if: ?You think you are having a reaction to medicines you have taken. ?You have recurrent headaches or feel dizzy. ?You have swelling in your ankles. ?You have trouble with your vision. ?Get help right away if: ?You have sudden, severe chest, back, or abdominal pain or discomfort. ?You have shortness of breath. ?You have a sudden, severe headache. ?These symptoms may be an emergency. Get help right away. Call 911. ?Do not wait to see if the symptoms will go away. ?Do not drive yourself to the hospital. ?Summary ?Hypertension often does not cause any symptoms until blood pressure is very high. It is important to get your blood pressure checked regularly. ?Diet and lifestyle changes are important steps in preventing hypertension. ?By keeping your blood pressure in a healthy range, you may prevent complications like heart attack, heart failure, stroke, and kidney failure. ?Work with your health care provider to make a hypertension prevention plan that works for you. ?This information is not intended to replace advice given to you by your health care provider. Make sure you discuss any questions you have with your health care provider. ?Document Revised: 10/31/2020 Document Reviewed: 10/31/2020 ?Elsevier Patient Education ? [place]. ?Health Maintenance, Female ?Adopting a healthy lifestyle and getting preventive care are important  in promoting health and wellness. Ask your health care provider about: ?The right schedule for you to have regular tests and exams. ?Things you can do on your own to prevent diseases and keep yourself healthy. ?What should I know about diet, weight, and exercise? ?Eat a healthy diet ? ?Eat a diet that includes plenty of vegetables, fruits, low-fat dairy products, and lean protein. ?Do not eat a lot of foods that are high in solid fats, added sugars, or sodium. ?Maintain a healthy weight ?Body mass index (BMI) is used  to identify weight problems. It estimates body fat based on height and weight. Your health care provider can help determine your BMI and help you achieve or maintain a healthy weight. ?Get regular exercise

## 2021-05-21 ENCOUNTER — Encounter: Payer: Self-pay | Admitting: Physician Assistant

## 2021-05-21 DIAGNOSIS — E7800 Pure hypercholesterolemia, unspecified: Secondary | ICD-10-CM | POA: Insufficient documentation

## 2021-05-21 LAB — CBC WITH DIFFERENTIAL/PLATELET
Absolute Monocytes: 437 cells/uL (ref 200–950)
Basophils Absolute: 30 cells/uL (ref 0–200)
Basophils Relative: 0.5 %
Eosinophils Absolute: 77 cells/uL (ref 15–500)
Eosinophils Relative: 1.3 %
HCT: 42.6 % (ref 35.0–45.0)
Hemoglobin: 14.3 g/dL (ref 11.7–15.5)
Lymphs Abs: 1717 cells/uL (ref 850–3900)
MCH: 30.3 pg (ref 27.0–33.0)
MCHC: 33.6 g/dL (ref 32.0–36.0)
MCV: 90.3 fL (ref 80.0–100.0)
MPV: 10.9 fL (ref 7.5–12.5)
Monocytes Relative: 7.4 %
Neutro Abs: 3640 cells/uL (ref 1500–7800)
Neutrophils Relative %: 61.7 %
Platelets: 162 10*3/uL (ref 140–400)
RBC: 4.72 10*6/uL (ref 3.80–5.10)
RDW: 13 % (ref 11.0–15.0)
Total Lymphocyte: 29.1 %
WBC: 5.9 10*3/uL (ref 3.8–10.8)

## 2021-05-21 LAB — COMPLETE METABOLIC PANEL WITH GFR
AG Ratio: 1.7 (calc) (ref 1.0–2.5)
ALT: 29 U/L (ref 6–29)
AST: 30 U/L (ref 10–35)
Albumin: 4.4 g/dL (ref 3.6–5.1)
Alkaline phosphatase (APISO): 80 U/L (ref 37–153)
BUN: 14 mg/dL (ref 7–25)
CO2: 27 mmol/L (ref 20–32)
Calcium: 9.3 mg/dL (ref 8.6–10.4)
Chloride: 103 mmol/L (ref 98–110)
Creat: 0.78 mg/dL (ref 0.50–1.05)
Globulin: 2.6 g/dL (calc) (ref 1.9–3.7)
Glucose, Bld: 85 mg/dL (ref 65–139)
Potassium: 4.5 mmol/L (ref 3.5–5.3)
Sodium: 139 mmol/L (ref 135–146)
Total Bilirubin: 1 mg/dL (ref 0.2–1.2)
Total Protein: 7 g/dL (ref 6.1–8.1)
eGFR: 86 mL/min/{1.73_m2} (ref >=60)

## 2021-05-21 LAB — TSH: TSH: 0.5 mIU/L (ref 0.40–4.50)

## 2021-05-21 LAB — LIPID PANEL W/REFLEX DIRECT LDL
Cholesterol: 256 mg/dL — ABNORMAL HIGH (ref <200)
HDL: 81 mg/dL (ref >=50)
LDL Cholesterol (Calc): 154 mg/dL (calc) — ABNORMAL HIGH
Non-HDL Cholesterol (Calc): 175 mg/dL (calc) — ABNORMAL HIGH (ref <130)
Total CHOL/HDL Ratio: 3.2 (calc) (ref <5.0)
Triglycerides: 98 mg/dL (ref <150)

## 2021-05-21 LAB — VITAMIN D 25 HYDROXY (VIT D DEFICIENCY, FRACTURES): Vit D, 25-Hydroxy: 48 ng/mL (ref 30–100)

## 2021-05-21 NOTE — Progress Notes (Signed)
Thyroid looks good. Refills already sent.  ?Kidney, liver, glucose look GREAT.  ?Vitamin D is good.  ?Hemoglobin and WBC look great.  ? ?Marland Kitchen.The 10-year ASCVD risk score (Arnett DK, et al., 2019) is: 4.5% ?  Values used to calculate the score: ?    Age: 61 years ?    Sex: Female ?    Is Non-Hispanic African American: No ?    Diabetic: No ?    Tobacco smoker: No ?    Systolic Blood Pressure: 481 mmHg ?    Is BP treated: No ?    HDL Cholesterol: 81 mg/dL ?    Total Cholesterol: 256 mg/dL ? ?Your 10 year risk is 4.5 percent which is still below 7.5 percent that it is recommended to treat cholesterol. Continue to keep healthy diet and exercise. Will monitor yearly for changes.

## 2021-05-23 DIAGNOSIS — G479 Sleep disorder, unspecified: Secondary | ICD-10-CM | POA: Insufficient documentation

## 2021-05-29 DIAGNOSIS — Z1231 Encounter for screening mammogram for malignant neoplasm of breast: Secondary | ICD-10-CM | POA: Diagnosis not present

## 2021-05-29 DIAGNOSIS — Z01419 Encounter for gynecological examination (general) (routine) without abnormal findings: Secondary | ICD-10-CM | POA: Diagnosis not present

## 2021-05-29 DIAGNOSIS — Z780 Asymptomatic menopausal state: Secondary | ICD-10-CM | POA: Diagnosis not present

## 2021-05-29 DIAGNOSIS — Z1389 Encounter for screening for other disorder: Secondary | ICD-10-CM | POA: Diagnosis not present

## 2021-05-29 DIAGNOSIS — Z130 Encounter for screening for diseases of the blood and blood-forming organs and certain disorders involving the immune mechanism: Secondary | ICD-10-CM | POA: Diagnosis not present

## 2021-05-30 LAB — HM MAMMOGRAPHY

## 2021-06-20 ENCOUNTER — Encounter: Payer: Self-pay | Admitting: Neurology

## 2021-06-24 ENCOUNTER — Encounter: Payer: Self-pay | Admitting: Neurology

## 2021-08-27 ENCOUNTER — Encounter: Payer: Self-pay | Admitting: Neurology

## 2021-09-03 ENCOUNTER — Encounter: Payer: Self-pay | Admitting: Physician Assistant

## 2021-09-03 ENCOUNTER — Ambulatory Visit (INDEPENDENT_AMBULATORY_CARE_PROVIDER_SITE_OTHER): Payer: BC Managed Care – PPO | Admitting: Physician Assistant

## 2021-09-03 VITALS — BP 133/77 | HR 85 | Ht 64.0 in | Wt 195.0 lb

## 2021-09-03 DIAGNOSIS — E6609 Other obesity due to excess calories: Secondary | ICD-10-CM

## 2021-09-03 DIAGNOSIS — Z6833 Body mass index (BMI) 33.0-33.9, adult: Secondary | ICD-10-CM | POA: Diagnosis not present

## 2021-09-03 DIAGNOSIS — R030 Elevated blood-pressure reading, without diagnosis of hypertension: Secondary | ICD-10-CM | POA: Diagnosis not present

## 2021-09-03 NOTE — Patient Instructions (Signed)
Calm aid for sleep

## 2021-09-03 NOTE — Progress Notes (Signed)
Established Patient Office Visit  Subjective   Patient ID: Kelly Wheeler, female    DOB: 1960/02/12  Age: 61 y.o. MRN: [id number]  Chief Complaint  Patient presents with   Follow-up    HPI Pt is a 61 yo obese female who presents to the clinic for BP recheck. Last visit BP was elevated. Pt is checking at home but BP is all over the place with wrist cuff. Denies any CP, palpitations, headaches, vision changes. She is not on any medication. She is walking some throughout the week but not regularly.   Pt does have controlled hypothyroidism on medication.   .. Active Ambulatory Problems    Diagnosis Date Noted   Hypothyroidism 12/09/2009   Menopausal symptoms 09/12/2013   Hyperlipidemia 03/28/2014   Elevated liver enzymes 03/28/2014   Elevated fasting glucose 02/19/2015   Anterior cruciate ligament complete tear, left, initial encounter 12/17/2015   Overweight (BMI 25.0-29.9) 01/01/2017   Hot flashes 01/02/2017   B12 deficiency 01/02/2017   Class 1 obesity due to excess calories without serious comorbidity with body mass index (BMI) of 33.0 to 33.9 in adult 09/25/2017   Sinus drainage 04/15/2018   Seasonal allergies 04/15/2018   Vitamin D deficiency 11/07/2018   History of abnormal cervical Papanicolaou smear 05/19/2021   Elevated blood pressure reading 05/20/2021   Elevated LDL cholesterol level 05/21/2021   Trouble in sleeping 05/23/2021   Resolved Ambulatory Problems    Diagnosis Date Noted   EPIDERMOID CYST 05/09/2009   COSTOCHONDRITIS, LEFT 04/16/2009   WEIGHT GAIN 05/09/2009   CHEST PAIN, LEFT 04/16/2009   No Additional Past Medical History      Review of Systems  All other systems reviewed and are negative.     Objective:     BP 133/77   Pulse 85   Ht '5\' 4"'$  (1.626 m)   Wt 195 lb (88.5 kg)   LMP 05/09/2010   SpO2 98%   BMI 33.47 kg/m  BP Readings from Last 3 Encounters:  09/03/21 133/77  05/20/21 (!) 147/87  04/03/20 122/86   Wt Readings from  Last 3 Encounters:  09/03/21 195 lb (88.5 kg)  05/20/21 195 lb (88.5 kg)  08/16/20 197 lb (89.4 kg)      Physical Exam Constitutional:      Appearance: Normal appearance. She is obese.  HENT:     Head: Normocephalic.  Cardiovascular:     Rate and Rhythm: Normal rate and regular rhythm.     Pulses: Normal pulses.  Pulmonary:     Effort: Pulmonary effort is normal.     Breath sounds: Normal breath sounds.  Musculoskeletal:     Right lower leg: No edema.     Left lower leg: No edema.  Neurological:     General: No focal deficit present.     Mental Status: She is alert and oriented to person, place, and time.  Psychiatric:        Mood and Affect: Mood normal.         Assessment & Plan:  Marland KitchenMarland KitchenPaylin was seen today for follow-up.  Diagnoses and all orders for this visit:  Elevated blood pressure reading  Class 1 obesity due to excess calories without serious comorbidity with body mass index (BMI) of 33.0 to 33.9 in adult  BP much better today.  Discussed weight loss.  Consider regular exercise 150 minutes a week and Mediterranean diet  Follow up in April at next CPE   Return in about 8 months (around 05/11/2022)  for Needs CPE.    Iran Planas, PA-C

## 2021-10-02 ENCOUNTER — Encounter: Payer: Self-pay | Admitting: Physician Assistant

## 2021-10-03 NOTE — Telephone Encounter (Signed)
See other MyChart message

## 2021-10-04 ENCOUNTER — Telehealth: Payer: BC Managed Care – PPO | Admitting: Nurse Practitioner

## 2021-10-04 DIAGNOSIS — J069 Acute upper respiratory infection, unspecified: Secondary | ICD-10-CM

## 2021-10-04 MED ORDER — BENZONATATE 100 MG PO CAPS: 100.0000 mg | ORAL_CAPSULE | Freq: Two times a day (BID) | ORAL | 0 refills | Status: DC | PRN

## 2021-10-04 NOTE — Progress Notes (Signed)
We are sorry that you are not feeling well.  Here is how we plan to help!  Based on your presentation I believe you most likely have A cough due to a virus.  This is called viral bronchitis and is best treated by rest, plenty of fluids and control of the cough.  You may use Ibuprofen or Tylenol as directed to help your symptoms.     In addition you may use A prescription cough medication called Tessalon Perles '100mg'$ . You may take 1-2 capsules every 8 hours as needed for your cough.   From your responses in the eVisit questionnaire you describe inflammation in the upper respiratory tract which is causing a significant cough.  This is commonly called Bronchitis and has four common causes:   Allergies Viral Infections Acid Reflux Bacterial Infection Allergies, viruses and acid reflux are treated by controlling symptoms or eliminating the cause. An example might be a cough caused by taking certain blood pressure medications. You stop the cough by changing the medication. Another example might be a cough caused by acid reflux. Controlling the reflux helps control the cough.  USE OF BRONCHODILATOR ("RESCUE") INHALERS: There is a risk from using your bronchodilator too frequently.  The risk is that over-reliance on a medication which only relaxes the muscles surrounding the breathing tubes can reduce the effectiveness of medications prescribed to reduce swelling and congestion of the tubes themselves.  Although you feel brief relief from the bronchodilator inhaler, your asthma may actually be worsening with the tubes becoming more swollen and filled with mucus.  This can delay other crucial treatments, such as oral steroid medications. If you need to use a bronchodilator inhaler daily, several times per day, you should discuss this with your provider.  There are probably better treatments that could be used to keep your asthma under control.     HOME CARE Only take medications as instructed by your  medical team. Complete the entire course of an antibiotic. Drink plenty of fluids and get plenty of rest. Avoid close contacts especially the very young and the elderly Cover your mouth if you cough or cough into your sleeve. Always remember to wash your hands A steam or ultrasonic humidifier can help congestion.   GET HELP RIGHT AWAY IF: You develop worsening fever. You become short of breath You cough up blood. Your symptoms persist after you have completed your treatment plan MAKE SURE YOU  Understand these instructions. Will watch your condition. Will get help right away if you are not doing well or get worse.    Thank you for choosing an e-visit.  Your e-visit answers were reviewed by a board certified advanced clinical practitioner to complete your personal care plan. Depending upon the condition, your plan could have included both over the counter or prescription medications.  Please review your pharmacy choice. Make sure the pharmacy is open so you can pick up prescription now. If there is a problem, you may contact your provider through [employer] and have the prescription routed to another pharmacy.  Your safety is important to [place]. If you have drug allergies check your prescription carefully.   For the next 24 hours you can use MyChart to ask questions about today's visit, request a non-urgent call back, or ask for a work or school excuse. You will get an email in the next two days asking about your experience. I hope that your e-visit has been valuable and will speed your recovery.  5-10 minutes spent reviewing  and documenting in chart.

## 2021-10-06 ENCOUNTER — Telehealth: Payer: BC Managed Care – PPO | Admitting: Physician Assistant

## 2021-10-06 DIAGNOSIS — J208 Acute bronchitis due to other specified organisms: Secondary | ICD-10-CM | POA: Diagnosis not present

## 2021-10-06 MED ORDER — PREDNISONE 20 MG PO TABS: 20.0000 mg | ORAL_TABLET | Freq: Every day | ORAL | 0 refills | Status: DC

## 2021-10-06 MED ORDER — GUAIFENESIN ER 600 MG PO TB12: 600.0000 mg | ORAL_TABLET | Freq: Two times a day (BID) | ORAL | 0 refills | Status: DC

## 2021-10-06 NOTE — Patient Instructions (Signed)
Shelbie Hutching, thank you for joining Mar Daring, PA-C for today's virtual visit.  While this provider is not your primary care provider (PCP), if your PCP is located in our provider database this encounter information will be shared with them immediately following your visit.  Consent: (Patient) Kelly Wheeler provided verbal consent for this virtual visit at the beginning of the encounter.  Current Medications:  Current Outpatient Medications:    guaiFENesin (MUCINEX) 600 MG 12 hr tablet, Take 1 tablet (600 mg total) by mouth 2 (two) times daily., Disp: 30 tablet, Rfl: 0   predniSONE (DELTASONE) 20 MG tablet, Take 1 tablet (20 mg total) by mouth daily with breakfast., Disp: 5 tablet, Rfl: 0   benzonatate (TESSALON) 100 MG capsule, Take 1 capsule (100 mg total) by mouth 2 (two) times daily as needed for cough., Disp: 20 capsule, Rfl: 0   SYNTHROID 112 MCG tablet, Take 1 tablet (112 mcg total) by mouth daily before breakfast., Disp: 30 tablet, Rfl: 11   Vitamin D, Ergocalciferol, (DRISDOL) 1.25 MG (50000 UNIT) CAPS capsule, Take 1 capsule (50,000 Units total) by mouth every 7 (seven) days., Disp: 12 capsule, Rfl: 3   Medications ordered in this encounter:  Meds ordered this encounter  Medications   predniSONE (DELTASONE) 20 MG tablet    Sig: Take 1 tablet (20 mg total) by mouth daily with breakfast.    Dispense:  5 tablet    Refill:  0    Order Specific Question:   Supervising Provider    Answer:   Chase Picket [[id number]]   guaiFENesin (MUCINEX) 600 MG 12 hr tablet    Sig: Take 1 tablet (600 mg total) by mouth 2 (two) times daily.    Dispense:  30 tablet    Refill:  0    Order Specific Question:   Supervising Provider    Answer:   Chase Picket [id number]     *If you need refills on other medications prior to your next appointment, please contact your pharmacy*  Follow-Up: Call back or seek an in-person evaluation if the symptoms worsen or if the condition fails to  improve as anticipated.  Other Instructions Acute Bronchitis, Adult  Acute bronchitis is sudden inflammation of the main airways (bronchi) that come off the windpipe (trachea) in the lungs. The swelling causes the airways to get smaller and make more mucus than normal. This can make it hard to breathe and can cause coughing or noisy breathing (wheezing). Acute bronchitis may last several weeks. The cough may last longer. Allergies, asthma, and exposure to smoke may make the condition worse. What are the causes? This condition can be caused by germs and by substances that irritate the lungs, including: Cold and flu viruses. The most common cause of this condition is the virus that causes the common cold. Bacteria. This is less common. Breathing in substances that irritate the lungs, including: Smoke from cigarettes and other forms of tobacco. Dust and pollen. Fumes from household cleaning products, gases, or burned fuel. Indoor or outdoor air pollution. What increases the risk? The following factors may make you more likely to develop this condition: A weak body's defense system, also called the immune system. A condition that affects your lungs and breathing, such as asthma. What are the signs or symptoms? Common symptoms of this condition include: Coughing. This may bring up clear, yellow, or green mucus from your lungs (sputum). Wheezing. Runny or stuffy nose. Having too much mucus in your lungs (  chest congestion). Shortness of breath. Aches and pains, including sore throat or chest. How is this diagnosed? This condition is usually diagnosed based on: Your symptoms and medical history. A physical exam. You may also have other tests, including tests to rule out other conditions, such as pneumonia. These tests include: A test of lung function. Test of a mucus sample to look for the presence of bacteria. Tests to check the oxygen level in your blood. Blood tests. Chest X-ray. How  is this treated? Most cases of acute bronchitis clear up over time without treatment. Your health care provider may recommend: Drinking more fluids to help thin your mucus so it is easier to cough up. Taking inhaled medicine (inhaler) to improve air flow in and out of your lungs. Using a vaporizer or a humidifier. These are machines that add water to the air to help you breathe better. Taking a medicine that thins mucus and clears congestion (expectorant). Taking a medicine that prevents or stops coughing (cough suppressant). It is not common to take an antibiotic medicine for this condition. Follow these instructions at home:  Take over-the-counter and prescription medicines only as told by your health care provider. Use an inhaler, vaporizer, or humidifier as told by your health care provider. Take two teaspoons (10 mL) of honey at bedtime to lessen coughing at night. Drink enough fluid to keep your urine pale yellow. Do not use any products that contain nicotine or tobacco. These products include cigarettes, chewing tobacco, and vaping devices, such as e-cigarettes. If you need help quitting, ask your health care provider. Get plenty of rest. Return to your normal activities as told by your health care provider. Ask your health care provider what activities are safe for you. Keep all follow-up visits. This is important. How is this prevented? To lower your risk of getting this condition again: Wash your hands often with soap and water for at least 20 seconds. If soap and water are not available, use hand sanitizer. Avoid contact with people who have cold symptoms. Try not to touch your mouth, nose, or eyes with your hands. Avoid breathing in smoke or chemical fumes. Breathing smoke or chemical fumes will make your condition worse. Get the flu shot every year. Contact a health care provider if: Your symptoms do not improve after 2 weeks. You have trouble coughing up the mucus. Your  cough keeps you awake at night. You have a fever. Get help right away if you: Cough up blood. Feel pain in your chest. Have severe shortness of breath. Faint or keep feeling like you are going to faint. Have a severe headache. Have a fever or chills that get worse. These symptoms may represent a serious problem that is an emergency. Do not wait to see if the symptoms will go away. Get medical help right away. Call your local emergency services (911 in the [place] Do not drive yourself to the hospital. Summary Acute bronchitis is inflammation of the main airways (bronchi) that come off the windpipe (trachea) in the lungs. The swelling causes the airways to get smaller and make more mucus than normal. Drinking more fluids can help thin your mucus so it is easier to cough up. Take over-the-counter and prescription medicines only as told by your health care provider. Do not use any products that contain nicotine or tobacco. These products include cigarettes, chewing tobacco, and vaping devices, such as e-cigarettes. If you need help quitting, ask your health care provider. Contact a health care provider if  your symptoms do not improve after 2 weeks. This information is not intended to replace advice given to you by your health care provider. Make sure you discuss any questions you have with your health care provider. Document Revised: 04/24/2021 Document Reviewed: 05/15/2020 Elsevier Patient Education  [place].    If you have been instructed to have an in-person evaluation today at a local Urgent Care facility, please use the link below. It will take you to a list of all of our available [facility], including address, phone number and hours of operation. Please do not delay care.  [facility]  If you or a family member do not have a primary care provider, use the link below to schedule a visit and establish care. When you choose a [facility] primary care  physician or advanced practice provider, you gain a long-term partner in health. Find a Primary Care Provider  Learn more about [facility] in-office and virtual care options: [facility]

## 2021-10-06 NOTE — Progress Notes (Signed)
Virtual Visit Consent   Kelly Wheeler, you are scheduled for a virtual visit with a [facility] provider today. Just as with appointments in the office, your consent must be obtained to participate. Your consent will be active for this visit and any virtual visit you may have with one of our providers in the next 365 days. If you have a MyChart account, a copy of this consent can be sent to you electronically.  As this is a virtual visit, video technology does not allow for your provider to perform a traditional examination. This may limit your provider's ability to fully assess your condition. If your provider identifies any concerns that need to be evaluated in person or the need to arrange testing (such as labs, EKG, etc.), we will make arrangements to do so. Although advances in technology are sophisticated, we cannot ensure that it will always work on either your end or our end. If the connection with a video visit is poor, the visit may have to be switched to a telephone visit. With either a video or telephone visit, we are not always able to ensure that we have a secure connection.  By engaging in this virtual visit, you consent to the provision of healthcare and authorize for your insurance to be billed (if applicable) for the services provided during this visit. Depending on your insurance coverage, you may receive a charge related to this service.  I need to obtain your verbal consent now. Are you willing to proceed with your visit today? Kelly Wheeler has provided verbal consent on 10/06/2021 for a virtual visit (video or telephone). Mar Daring, PA-C  Date: 10/06/2021 3:45 PM  Virtual Visit via Video Note   I, Mar Daring, connected with  Kelly Wheeler  ([id number], Nov 12, 1960) on 10/06/21 at  3:30 PM EDT by a video-enabled telemedicine application and verified that I am speaking with the correct person using two identifiers.  Location: Patient: Virtual Visit Location  Patient: Home Provider: Virtual Visit Location Provider: Home Office   I discussed the limitations of evaluation and management by telemedicine and the availability of in person appointments. The patient expressed understanding and agreed to proceed.    History of Present Illness: Kelly Wheeler is a 61 y.o. who identifies as a female who was assigned female at birth, and is being seen today for continued cough.  HPI: Cough This is a new problem. The current episode started 1 to 4 weeks ago. The problem has been gradually worsening. The problem occurs every few minutes. The cough is Productive of sputum (clear sputum now). Associated symptoms include chest pain, chills, headaches (initially, now improved), postnasal drip and a sore throat (scratchy throat over 12 days ago; now improved). Pertinent negatives include no fever, myalgias, nasal congestion, rhinorrhea or wheezing. Associated symptoms comments: Swollen glands in neck. The symptoms are aggravated by lying down. She has tried OTC cough suppressant and prescription cough suppressant for the symptoms. The treatment provided no relief. Her past medical history is significant for bronchitis. There is no history of asthma or pneumonia.     Problems:  Patient Active Problem List   Diagnosis Date Noted   Trouble in sleeping 05/23/2021   Elevated LDL cholesterol level 05/21/2021   Elevated blood pressure reading 05/20/2021   History of abnormal cervical Papanicolaou smear 05/19/2021   Vitamin D deficiency 11/07/2018   Sinus drainage 04/15/2018   Seasonal allergies 04/15/2018   Class 1 obesity due to excess calories  without serious comorbidity with body mass index (BMI) of 33.0 to 33.9 in adult 09/25/2017   Hot flashes 01/02/2017   B12 deficiency 01/02/2017   Overweight (BMI 25.0-29.9) 01/01/2017   Anterior cruciate ligament complete tear, left, initial encounter 12/17/2015   Elevated fasting glucose 02/19/2015   Hyperlipidemia 03/28/2014    Elevated liver enzymes 03/28/2014   Menopausal symptoms 09/12/2013   Hypothyroidism 12/09/2009    Allergies:  Allergies  Allergen Reactions   Rofecoxib Itching   Medications:  Current Outpatient Medications:    guaiFENesin (MUCINEX) 600 MG 12 hr tablet, Take 1 tablet (600 mg total) by mouth 2 (two) times daily., Disp: 30 tablet, Rfl: 0   predniSONE (DELTASONE) 20 MG tablet, Take 1 tablet (20 mg total) by mouth daily with breakfast., Disp: 5 tablet, Rfl: 0   benzonatate (TESSALON) 100 MG capsule, Take 1 capsule (100 mg total) by mouth 2 (two) times daily as needed for cough., Disp: 20 capsule, Rfl: 0   SYNTHROID 112 MCG tablet, Take 1 tablet (112 mcg total) by mouth daily before breakfast., Disp: 30 tablet, Rfl: 11   Vitamin D, Ergocalciferol, (DRISDOL) 1.25 MG (50000 UNIT) CAPS capsule, Take 1 capsule (50,000 Units total) by mouth every 7 (seven) days., Disp: 12 capsule, Rfl: 3  Observations/Objective: Patient is well-developed, well-nourished in no acute distress.  Resting comfortably at home.  Head is normocephalic, atraumatic.  No labored breathing.  Speech is clear and coherent with logical content.  Patient is alert and oriented at baseline.    Assessment and Plan: 1. Viral bronchitis - predniSONE (DELTASONE) 20 MG tablet; Take 1 tablet (20 mg total) by mouth daily with breakfast.  Dispense: 5 tablet; Refill: 0 - guaiFENesin (MUCINEX) 600 MG 12 hr tablet; Take 1 tablet (600 mg total) by mouth 2 (two) times daily.  Dispense: 30 tablet; Refill: 0  - Worsening over a week despite OTC medications - Will treat with Z-pack and tessalon perles - Can continue Mucinex  - Push fluids.  - Rest.  - Steam and humidifier can help - Seek in person evaluation if worsening or symptoms fail to improve    Follow Up Instructions: I discussed the assessment and treatment plan with the patient. The patient was provided an opportunity to ask questions and all were answered. The patient  agreed with the plan and demonstrated an understanding of the instructions.  A copy of instructions were sent to the patient via MyChart unless otherwise noted below.    The patient was advised to call back or seek an in-person evaluation if the symptoms worsen or if the condition fails to improve as anticipated.  Time:  I spent 12 minutes with the patient via telehealth technology discussing the above problems/concerns.    Mar Daring, PA-C

## 2021-10-09 ENCOUNTER — Encounter: Payer: Self-pay | Admitting: Physician Assistant

## 2021-10-10 MED ORDER — AZITHROMYCIN 250 MG PO TABS: ORAL_TABLET | ORAL | 0 refills | Status: DC

## 2022-05-06 ENCOUNTER — Ambulatory Visit: Payer: BC Managed Care – PPO | Admitting: Physician Assistant

## 2022-05-15 ENCOUNTER — Other Ambulatory Visit: Payer: Self-pay | Admitting: Physician Assistant

## 2022-05-15 DIAGNOSIS — E039 Hypothyroidism, unspecified: Secondary | ICD-10-CM

## 2022-05-19 ENCOUNTER — Ambulatory Visit (INDEPENDENT_AMBULATORY_CARE_PROVIDER_SITE_OTHER): Payer: BC Managed Care – PPO | Admitting: Physician Assistant

## 2022-05-19 VITALS — BP 151/86 | HR 70 | Ht 64.0 in | Wt 194.0 lb

## 2022-05-19 DIAGNOSIS — R030 Elevated blood-pressure reading, without diagnosis of hypertension: Secondary | ICD-10-CM

## 2022-05-19 DIAGNOSIS — E039 Hypothyroidism, unspecified: Secondary | ICD-10-CM

## 2022-05-19 DIAGNOSIS — Z1211 Encounter for screening for malignant neoplasm of colon: Secondary | ICD-10-CM | POA: Diagnosis not present

## 2022-05-19 DIAGNOSIS — E782 Mixed hyperlipidemia: Secondary | ICD-10-CM

## 2022-05-19 DIAGNOSIS — Z79899 Other long term (current) drug therapy: Secondary | ICD-10-CM | POA: Diagnosis not present

## 2022-05-19 DIAGNOSIS — Z131 Encounter for screening for diabetes mellitus: Secondary | ICD-10-CM

## 2022-05-20 ENCOUNTER — Encounter: Payer: Self-pay | Admitting: Physician Assistant

## 2022-05-20 NOTE — Progress Notes (Signed)
Established Patient Office Visit  Subjective   Patient ID: Kelly Wheeler, female    DOB: [birth year]  Age: 62 y.o. MRN: [id number]  Chief Complaint  Patient presents with   Follow-up    HPI Pt is a 62 yo obese female with hypothyroidism and HLD who presents to the clinic for medication refills.   She did not do colonoscopy because she could not drink contrast.   Denies any problems with medication. She is compliant. No concerns. She denies any CP, palpitations, headaches or vision changes. She does not check BP at home.    .. Active Ambulatory Problems    Diagnosis Date Noted   Hypothyroidism 12/09/2009   Menopausal symptoms 09/12/2013   Hyperlipidemia 03/28/2014   Elevated liver enzymes 03/28/2014   Elevated fasting glucose 02/19/2015   Anterior cruciate ligament complete tear, left, initial encounter 12/17/2015   Overweight (BMI 25.0-29.9) 01/01/2017   Hot flashes 01/02/2017   B12 deficiency 01/02/2017   Class 1 obesity due to excess calories without serious comorbidity with body mass index (BMI) of 33.0 to 33.9 in adult 09/25/2017   Sinus drainage 04/15/2018   Seasonal allergies 04/15/2018   Vitamin D deficiency 11/07/2018   History of abnormal cervical Papanicolaou smear 05/19/2021   Elevated blood pressure reading 05/20/2021   Elevated LDL cholesterol level 05/21/2021   Trouble in sleeping 05/23/2021   Resolved Ambulatory Problems    Diagnosis Date Noted   EPIDERMOID CYST 05/09/2009   COSTOCHONDRITIS, LEFT 04/16/2009   WEIGHT GAIN 05/09/2009   CHEST PAIN, LEFT 04/16/2009   No Additional Past Medical History     Review of Systems  All other systems reviewed and are negative.     Objective:     BP (!) 151/86   Pulse 70   Ht  (1.626 m)   Wt 194 lb (88 kg)   LMP 05/09/2010   SpO2 99%   BMI 33.30 kg/m  BP Readings from Last 3 Encounters:  05/19/22 (!) 151/86  09/03/21 133/77  05/20/21 (!) 147/87   Wt Readings from Last 3 Encounters:   05/19/22 194 lb (88 kg)  09/03/21 195 lb (88.5 kg)  05/20/21 195 lb (88.5 kg)      Physical Exam Constitutional:      Appearance: Normal appearance. She is obese.  HENT:     Head: Normocephalic.  Cardiovascular:     Rate and Rhythm: Normal rate and regular rhythm.     Pulses: Normal pulses.     Heart sounds: Normal heart sounds.  Pulmonary:     Effort: Pulmonary effort is normal.     Breath sounds: Normal breath sounds.  Musculoskeletal:     Cervical back: Normal range of motion and neck supple. No tenderness.     Right lower leg: No edema.     Left lower leg: No edema.  Lymphadenopathy:     Cervical: No cervical adenopathy.  Neurological:     General: No focal deficit present.     Mental Status: She is alert and oriented to person, place, and time.  Psychiatric:        Mood and Affect: Mood normal.       The 10-year ASCVD risk score (Arnett DK, et al., 2019) is: 5.3%    Assessment & Plan:  Marland KitchenMarland KitchenDonda was seen today for follow-up.  Diagnoses and all orders for this visit:  Hypothyroidism, unspecified type -     TSH + free T4  Colon cancer screening -  Cologuard  Mixed hyperlipidemia -     Lipid Panel w/reflex Direct LDL  Medication management -     TSH + free T4 -     COMPLETE METABOLIC PANEL WITH GFR  Screening for diabetes mellitus -     COMPLETE METABOLIC PANEL WITH GFR  Elevated blood pressure reading   Pt needs screening labs, ordered today.  BP elevated, start checking at home with goal of under 140/90.  Follow up in 2 weeks for nurse visit recheck Discussed decrease salt in diet and start exercising regularly Synthroid to be adjusted as needed by labs Cologuard ordered for colon cancer screening since patient could not drink contrast.     Return in about 2 weeks (around 06/02/2022) for BP nurse visit recheck.    Tandy Gaw, PA-C

## 2022-05-28 ENCOUNTER — Other Ambulatory Visit: Payer: Self-pay | Admitting: Gynecology

## 2022-05-28 DIAGNOSIS — Z1231 Encounter for screening mammogram for malignant neoplasm of breast: Secondary | ICD-10-CM

## 2022-06-11 DIAGNOSIS — E782 Mixed hyperlipidemia: Secondary | ICD-10-CM | POA: Diagnosis not present

## 2022-06-11 DIAGNOSIS — Z79899 Other long term (current) drug therapy: Secondary | ICD-10-CM | POA: Diagnosis not present

## 2022-06-11 DIAGNOSIS — Z131 Encounter for screening for diabetes mellitus: Secondary | ICD-10-CM | POA: Diagnosis not present

## 2022-06-11 DIAGNOSIS — E039 Hypothyroidism, unspecified: Secondary | ICD-10-CM | POA: Diagnosis not present

## 2022-06-12 ENCOUNTER — Encounter: Payer: Self-pay | Admitting: Physician Assistant

## 2022-06-12 LAB — T4, FREE: Free T4: 1.4 ng/dL (ref 0.8–1.8)

## 2022-06-12 LAB — COMPLETE METABOLIC PANEL WITH GFR
AG Ratio: 1.6 (calc) (ref 1.0–2.5)
ALT: 24 U/L (ref 6–29)
AST: 25 U/L (ref 10–35)
Albumin: 4.1 g/dL (ref 3.6–5.1)
Alkaline phosphatase (APISO): 84 U/L (ref 37–153)
BUN: 11 mg/dL (ref 7–25)
CO2: 27 mmol/L (ref 20–32)
Calcium: 8.9 mg/dL (ref 8.6–10.4)
Chloride: 105 mmol/L (ref 98–110)
Creat: 0.75 mg/dL (ref 0.50–1.05)
Globulin: 2.6 g/dL (calc) (ref 1.9–3.7)
Glucose, Bld: 123 mg/dL — ABNORMAL HIGH (ref 65–99)
Potassium: 4.5 mmol/L (ref 3.5–5.3)
Sodium: 142 mmol/L (ref 135–146)
Total Bilirubin: 0.7 mg/dL (ref 0.2–1.2)
Total Protein: 6.7 g/dL (ref 6.1–8.1)
eGFR: 90 mL/min/{1.73_m2} (ref >=60)

## 2022-06-12 LAB — LIPID PANEL W/REFLEX DIRECT LDL
Cholesterol: 235 mg/dL — ABNORMAL HIGH (ref <200)
HDL: 79 mg/dL (ref >=50)
LDL Cholesterol (Calc): 132 mg/dL (calc) — ABNORMAL HIGH
Non-HDL Cholesterol (Calc): 156 mg/dL (calc) — ABNORMAL HIGH (ref <130)
Total CHOL/HDL Ratio: 3 (calc) (ref <5.0)
Triglycerides: 127 mg/dL (ref <150)

## 2022-06-12 LAB — TSH+FREE T4: TSH W/REFLEX TO FT4: 0.37 mIU/L — ABNORMAL LOW (ref 0.40–4.50)

## 2022-06-12 NOTE — Progress Notes (Signed)
Franca,   TSH low but free t4 in normal range. How are you feeling? If you are not having any HYPER thyroid symptoms we can leave the same and recheck labs in 6 months.   Fasting glucose is elevated. We need to add A!C for better evaluation for diabetes.   LDL is better than a year ago.  HDL, good cholesterol, looks amazing.  10 year CV risk is 5.1 and under 7.5 where statin medication is suggested. Your risk continues to increase. Starting a low dose statin could help to prevent CV event. Is this something you would like to consider?    Marland KitchenMarland KitchenThe 10-year ASCVD risk score (Arnett DK, et al., 2019) is: 5.1%   Values used to calculate the score:     Age: 62 years     Sex: Female     Is Non-Hispanic African American: No     Diabetic: No     Tobacco smoker: No     Systolic Blood Pressure: 151 mmHg     Is BP treated: No     HDL Cholesterol: 79 mg/dL     Total Cholesterol: 235 mg/dL

## 2022-06-19 ENCOUNTER — Other Ambulatory Visit: Payer: Self-pay | Admitting: Physician Assistant

## 2022-06-19 DIAGNOSIS — E039 Hypothyroidism, unspecified: Secondary | ICD-10-CM

## 2022-06-23 ENCOUNTER — Encounter: Payer: Self-pay | Admitting: Physician Assistant

## 2022-06-23 ENCOUNTER — Other Ambulatory Visit: Payer: Self-pay | Admitting: Physician Assistant

## 2022-06-23 DIAGNOSIS — E039 Hypothyroidism, unspecified: Secondary | ICD-10-CM

## 2022-06-23 MED ORDER — SYNTHROID 112 MCG PO TABS
112.0000 ug | ORAL_TABLET | Freq: Every day | ORAL | 1 refills | Status: DC
Start: 2022-06-23 — End: 2022-06-23

## 2022-06-23 MED ORDER — SYNTHROID 112 MCG PO TABS
112.0000 ug | ORAL_TABLET | Freq: Every day | ORAL | 5 refills | Status: AC
Start: 2022-06-23 — End: ?

## 2022-06-24 DIAGNOSIS — Z1389 Encounter for screening for other disorder: Secondary | ICD-10-CM | POA: Diagnosis not present

## 2022-06-24 DIAGNOSIS — Z130 Encounter for screening for diseases of the blood and blood-forming organs and certain disorders involving the immune mechanism: Secondary | ICD-10-CM | POA: Diagnosis not present

## 2022-06-24 DIAGNOSIS — Z780 Asymptomatic menopausal state: Secondary | ICD-10-CM | POA: Diagnosis not present

## 2022-06-24 DIAGNOSIS — Z01419 Encounter for gynecological examination (general) (routine) without abnormal findings: Secondary | ICD-10-CM | POA: Diagnosis not present

## 2022-07-01 ENCOUNTER — Ambulatory Visit
Admission: RE | Admit: 2022-07-01 | Discharge: 2022-07-01 | Disposition: A | Discharge status: Home / Self Care | Payer: BC Managed Care – PPO | Source: Ambulatory Visit | Attending: Gynecology | Admitting: Gynecology

## 2022-07-01 DIAGNOSIS — Z1231 Encounter for screening mammogram for malignant neoplasm of breast: Secondary | ICD-10-CM | POA: Diagnosis not present

## 2022-07-24 DIAGNOSIS — Z1211 Encounter for screening for malignant neoplasm of colon: Secondary | ICD-10-CM | POA: Diagnosis not present

## 2022-07-29 LAB — COLOGUARD: COLOGUARD: NEGATIVE

## 2022-07-29 NOTE — Progress Notes (Signed)
Negative cologuard. Re-test in 3 years.

## 2022-09-24 ENCOUNTER — Telehealth: Payer: BC Managed Care – PPO

## 2022-09-24 ENCOUNTER — Telehealth: Payer: BC Managed Care – PPO | Admitting: Physician Assistant

## 2022-09-24 DIAGNOSIS — J019 Acute sinusitis, unspecified: Secondary | ICD-10-CM | POA: Diagnosis not present

## 2022-09-24 DIAGNOSIS — B9689 Other specified bacterial agents as the cause of diseases classified elsewhere: Secondary | ICD-10-CM

## 2022-09-24 MED ORDER — AMOXICILLIN-POT CLAVULANATE 875-125 MG PO TABS
1.0000 | ORAL_TABLET | Freq: Two times a day (BID) | ORAL | 0 refills | Status: AC
Start: 2022-09-24 — End: ?

## 2022-09-24 MED ORDER — FLUTICASONE PROPIONATE 50 MCG/ACT NA SUSP
2.0000 | Freq: Every day | NASAL | 0 refills | Status: DC
Start: 2022-09-24 — End: 2022-11-02

## 2022-09-24 NOTE — Patient Instructions (Signed)
Ebbie Ridge, thank you for joining [name], PA-C for today's virtual visit.  While this provider is not your primary care provider (PCP), if your PCP is located in our provider database this encounter information will be shared with them immediately following your visit.   A [facility] MyChart account gives you access to today's visit and all your visits, tests, and labs performed at [facility] " click here if you don't have a [facility] MyChart account or go to [email or web address]  Consent: (Patient) Kelly Wheeler provided verbal consent for this virtual visit at the beginning of the encounter.  Current Medications:  Current Outpatient Medications:    SYNTHROID 112 MCG tablet, Take 1 tablet (112 mcg total) by mouth daily before breakfast. Needs appointment and lab work, Disp: 30 tablet, Rfl: 5   Vitamin D, Ergocalciferol, (DRISDOL) 1.25 MG (50000 UNIT) CAPS capsule, Take 1 capsule (50,000 Units total) by mouth every 7 (seven) days., Disp: 12 capsule, Rfl: 3   Medications ordered in this encounter:  No orders of the defined types were placed in this encounter.    *If you need refills on other medications prior to your next appointment, please contact your pharmacy*  Follow-Up: Call back or seek an in-person evaluation if the symptoms worsen or if the condition fails to improve as anticipated.  [facility] [phone]  Other Instructions Please take antibiotic as directed.  Increase fluid intake.  Use Saline nasal spray.  Take a daily multivitamin. Ok to use Mucinex-DM or Delsym OTC for cough. Restart the Flonase as directed once daily.  Place a humidifier in the bedroom.  Please call or return clinic if symptoms are not improving.  Sinusitis Sinusitis is redness, soreness, and swelling (inflammation) of the paranasal sinuses. Paranasal sinuses are air pockets within the bones of your face (beneath the eyes, the middle of the forehead,  or above the eyes). In healthy paranasal sinuses, mucus is able to drain out, and air is able to circulate through them by way of your nose. However, when your paranasal sinuses are inflamed, mucus and air can become trapped. This can allow bacteria and other germs to grow and cause infection. Sinusitis can develop quickly and last only a short time (acute) or continue over a long period (chronic). Sinusitis that lasts for more than 12 weeks is considered chronic.  CAUSES  Causes of sinusitis include: Allergies. Structural abnormalities, such as displacement of the cartilage that separates your nostrils (deviated septum), which can decrease the air flow through your nose and sinuses and affect sinus drainage. Functional abnormalities, such as when the small hairs (cilia) that line your sinuses and help remove mucus do not work properly or are not present. SYMPTOMS  Symptoms of acute and chronic sinusitis are the same. The primary symptoms are pain and pressure around the affected sinuses. Other symptoms include: Upper toothache. Earache. Headache. Bad breath. Decreased sense of smell and taste. A cough, which worsens when you are lying flat. Fatigue. Fever. Thick drainage from your nose, which often is green and may contain pus (purulent). Swelling and warmth over the affected sinuses. DIAGNOSIS  Your caregiver will perform a physical exam. During the exam, your caregiver may: Look in your nose for signs of abnormal growths in your nostrils (nasal polyps). Tap over the affected sinus to check for signs of infection. View the inside of your sinuses (endoscopy) with a special imaging device with a light attached (endoscope), which is inserted into your  sinuses. If your caregiver suspects that you have chronic sinusitis, one or more of the following tests may be recommended: Allergy tests. Nasal culture A sample of mucus is taken from your nose and sent to a lab and screened for  bacteria. Nasal cytology A sample of mucus is taken from your nose and examined by your caregiver to determine if your sinusitis is related to an allergy. TREATMENT  Most cases of acute sinusitis are related to a viral infection and will resolve on their own within 10 days. Sometimes medicines are prescribed to help relieve symptoms (pain medicine, decongestants, nasal steroid sprays, or saline sprays).  However, for sinusitis related to a bacterial infection, your caregiver will prescribe antibiotic medicines. These are medicines that will help kill the bacteria causing the infection.  Rarely, sinusitis is caused by a fungal infection. In theses cases, your caregiver will prescribe antifungal medicine. For some cases of chronic sinusitis, surgery is needed. Generally, these are cases in which sinusitis recurs more than 3 times per year, despite other treatments. HOME CARE INSTRUCTIONS  Drink plenty of water. Water helps thin the mucus so your sinuses can drain more easily. Use a humidifier. Inhale steam 3 to 4 times a day (for example, sit in the bathroom with the shower running). Apply a warm, moist washcloth to your face 3 to 4 times a day, or as directed by your caregiver. Use saline nasal sprays to help moisten and clean your sinuses. Take over-the-counter or prescription medicines for pain, discomfort, or fever only as directed by your caregiver. SEEK IMMEDIATE MEDICAL CARE IF: You have increasing pain or severe headaches. You have nausea, vomiting, or drowsiness. You have swelling around your face. You have vision problems. You have a stiff neck. You have difficulty breathing. MAKE SURE YOU:  Understand these instructions. Will watch your condition. Will get help right away if you are not doing well or get worse. Document Released: 01/12/2005 Document Revised: 04/06/2011 Document Reviewed: 01/27/2011 [facility] Patient Information 2014 [place], [place].    If you have been  instructed to have an in-person evaluation today at a local Urgent Care facility, please use the link below. It will take you to a list of all of our available [facility], including address, phone number and hours of operation. Please do not delay care.  [facility]  If you or a family member do not have a primary care provider, use the link below to schedule a visit and establish care. When you choose a [facility] primary care physician or advanced practice provider, you gain a long-term partner in health. Find a Primary Care Provider  Learn more about [facility]'s in-office and virtual care options: [facility] - Get Care Now

## 2022-09-24 NOTE — Progress Notes (Signed)
Virtual Visit Consent   Kelly Wheeler, you are scheduled for a virtual visit with a [facility] provider today. Just as with appointments in the office, your consent must be obtained to participate. Your consent will be active for this visit and any virtual visit you may have with one of our providers in the next 365 days. If you have a MyChart account, a copy of this consent can be sent to you electronically.  As this is a virtual visit, video technology does not allow for your provider to perform a traditional examination. This may limit your provider's ability to fully assess your condition. If your provider identifies any concerns that need to be evaluated in person or the need to arrange testing (such as labs, EKG, etc.), we will make arrangements to do so. Although advances in technology are sophisticated, we cannot ensure that it will always work on either your end or our end. If the connection with a video visit is poor, the visit may have to be switched to a telephone visit. With either a video or telephone visit, we are not always able to ensure that we have a secure connection.  By engaging in this virtual visit, you consent to the provision of healthcare and authorize for your insurance to be billed (if applicable) for the services provided during this visit. Depending on your insurance coverage, you may receive a charge related to this service.  I need to obtain your verbal consent now. Are you willing to proceed with your visit today? [name] has provided verbal consent on 09/24/2022 for a virtual visit (video or telephone). Kelly Wheeler, [place]  Date: 09/24/2022 5:57 PM  Virtual Visit via Video Note   I, Kelly Wheeler, connected with  TYTIONNA BESSANT  ([id number], 09-Oct-1960) on 09/24/22 at  6:15 PM EDT by a video-enabled telemedicine application and verified that I am speaking with the correct person using two identifiers.  Location: Patient: Virtual Visit Location  Patient: Home Provider: Virtual Visit Location Provider: Home Office   I discussed the limitations of evaluation and management by telemedicine and the availability of in person appointments. The patient expressed understanding and agreed to proceed.    History of Present Illness: Kelly Wheeler is a 62 y.o. who identifies as a female who was assigned female at birth, and is being seen today for possible sinus infection. Endorses symptoms starting over the weekend, worsening Sunday night. Started with sore throat, chills that have improved but now substantial and progressing head congestion, sinus pressure and now with facial pain. Now noting bilateral ear pressure and mild cough.  Denies recent travel or sick contact. Has not tested for COVID.   OTC -- Tylenol and Ibuprofen  HPI: HPI  Problems:  Patient Active Problem List   Diagnosis Date Noted   Trouble in sleeping 05/23/2021   Elevated LDL cholesterol level 05/21/2021   Elevated blood pressure reading 05/20/2021   History of abnormal cervical Papanicolaou smear 05/19/2021   Vitamin D deficiency 11/07/2018   Sinus drainage 04/15/2018   Seasonal allergies 04/15/2018   Class 1 obesity due to excess calories without serious comorbidity with body mass index (BMI) of 33.0 to 33.9 in adult 09/25/2017   Hot flashes 01/02/2017   B12 deficiency 01/02/2017   Overweight (BMI 25.0-29.9) 01/01/2017   Anterior cruciate ligament complete tear, left, initial encounter 12/17/2015   Elevated fasting glucose 02/19/2015   Hyperlipidemia 03/28/2014   Elevated liver enzymes 03/28/2014   Menopausal symptoms  09/12/2013   Hypothyroidism 12/09/2009    Allergies:  Allergies  Allergen Reactions   Rofecoxib Itching   Medications:  Current Outpatient Medications:    amoxicillin-clavulanate (AUGMENTIN) 875-125 MG tablet, Take 1 tablet by mouth 2 (two) times daily., Disp: 14 tablet, Rfl: 0   fluticasone (FLONASE) 50 MCG/ACT nasal spray, Place 2 sprays  into both nostrils daily., Disp: 16 g, Rfl: 0   SYNTHROID 112 MCG tablet, Take 1 tablet (112 mcg total) by mouth daily before breakfast. Needs appointment and lab work, Disp: 30 tablet, Rfl: 5   Vitamin D, Ergocalciferol, (DRISDOL) 1.25 MG (50000 UNIT) CAPS capsule, Take 1 capsule (50,000 Units total) by mouth every 7 (seven) days., Disp: 12 capsule, Rfl: 3  Observations/Objective: Patient is well-developed, well-nourished in no acute distress.  Resting comfortably  at home.  Head is normocephalic, atraumatic.  No labored breathing.  Speech is clear and coherent with logical content.  Patient is alert and oriented at baseline.   Assessment and Plan: 1. Acute bacterial sinusitis - fluticasone (FLONASE) 50 MCG/ACT nasal spray; Place 2 sprays into both nostrils daily.  Dispense: 16 g; Refill: 0 - amoxicillin-clavulanate (AUGMENTIN) 875-125 MG tablet; Take 1 tablet by mouth 2 (two) times daily.  Dispense: 14 tablet; Refill: 0  Rx Augmentin.  Increase fluids.  Rest.  Saline nasal spray.  Probiotic.  Mucinex as directed.  Humidifier in bedroom. Flonase per orders.  Call or return to clinic if symptoms are not improving.   Follow Up Instructions: I discussed the assessment and treatment plan with the patient. The patient was provided an opportunity to ask questions and all were answered. The patient agreed with the plan and demonstrated an understanding of the instructions.  A copy of instructions were sent to the patient via MyChart unless otherwise noted below.   The patient was advised to call back or seek an in-person evaluation if the symptoms worsen or if the condition fails to improve as anticipated.  Time:  I spent 10 minutes with the patient via telehealth technology discussing the above problems/concerns.    Kelly Climes, PA-C

## 2022-09-26 ENCOUNTER — Other Ambulatory Visit: Payer: Self-pay | Admitting: Physician Assistant

## 2022-09-26 DIAGNOSIS — E559 Vitamin D deficiency, unspecified: Secondary | ICD-10-CM

## 2022-09-29 ENCOUNTER — Ambulatory Visit (INDEPENDENT_AMBULATORY_CARE_PROVIDER_SITE_OTHER): Payer: BC Managed Care – PPO | Admitting: Physician Assistant

## 2022-09-29 ENCOUNTER — Encounter: Payer: Self-pay | Admitting: Physician Assistant

## 2022-09-29 VITALS — BP 128/82 | HR 74 | Temp 97.6°F

## 2022-09-29 DIAGNOSIS — B3731 Acute candidiasis of vulva and vagina: Secondary | ICD-10-CM

## 2022-09-29 DIAGNOSIS — J019 Acute sinusitis, unspecified: Secondary | ICD-10-CM | POA: Diagnosis not present

## 2022-09-29 DIAGNOSIS — B9689 Other specified bacterial agents as the cause of diseases classified elsewhere: Secondary | ICD-10-CM

## 2022-09-29 MED ORDER — FLUCONAZOLE 150 MG PO TABS
ORAL_TABLET | ORAL | 0 refills | Status: DC
Start: 2022-09-29 — End: 2022-11-02

## 2022-09-29 MED ORDER — AZITHROMYCIN 250 MG PO TABS
ORAL_TABLET | ORAL | 0 refills | Status: DC
Start: 2022-09-29 — End: 2022-11-02

## 2022-09-29 MED ORDER — METHYLPREDNISOLONE 4 MG PO TBPK
ORAL_TABLET | ORAL | 0 refills | Status: AC
Start: 2022-09-29 — End: ?

## 2022-09-29 NOTE — Progress Notes (Signed)
Acute Office Visit  Subjective:     Patient ID: Kelly Wheeler, female    DOB: 09/21/1960, 62 y.o.   MRN: [id number]  Chief Complaint  Patient presents with   Sinus Problem    HPI Patient is in today for sinus pressure, congestion, PND, cough for last 2 weeks. Her husband had similar symptoms. She went to UC and given augmentin but started having diarrhea and GI upset and stopped it. She was feeling some better.   No fever, chills, SOB, body aches.    .. Active Ambulatory Problems    Diagnosis Date Noted   Hypothyroidism 12/09/2009   Menopausal symptoms 09/12/2013   Hyperlipidemia 03/28/2014   Elevated liver enzymes 03/28/2014   Elevated fasting glucose 02/19/2015   Anterior cruciate ligament complete tear, left, initial encounter 12/17/2015   Overweight (BMI 25.0-29.9) 01/01/2017   Hot flashes 01/02/2017   B12 deficiency 01/02/2017   Class 1 obesity due to excess calories without serious comorbidity with body mass index (BMI) of 33.0 to 33.9 in adult 09/25/2017   Sinus drainage 04/15/2018   Seasonal allergies 04/15/2018   Vitamin D deficiency 11/07/2018   History of abnormal cervical Papanicolaou smear 05/19/2021   Elevated blood pressure reading 05/20/2021   Elevated LDL cholesterol level 05/21/2021   Trouble in sleeping 05/23/2021   Resolved Ambulatory Problems    Diagnosis Date Noted   EPIDERMOID CYST 05/09/2009   COSTOCHONDRITIS, LEFT 04/16/2009   WEIGHT GAIN 05/09/2009   CHEST PAIN, LEFT 04/16/2009   No Additional Past Medical History    ROS  See HPI.     Objective:    BP 128/82   Pulse 74   Temp 97.6 F (36.4 C)   LMP 05/09/2010  BP Readings from Last 3 Encounters:  09/29/22 128/82  05/19/22 (!) 151/86  09/03/21 133/77   Wt Readings from Last 3 Encounters:  05/19/22 194 lb (88 kg)  09/03/21 195 lb (88.5 kg)  05/20/21 195 lb (88.5 kg)      Physical Exam Constitutional:      Appearance: Normal appearance. She is obese.  HENT:     Head:  Normocephalic.     Comments: Tenderness over maxillary and frontal sinuses.     Right Ear: Tympanic membrane, ear canal and external ear normal. There is no impacted cerumen.     Left Ear: Tympanic membrane, ear canal and external ear normal. There is no impacted cerumen.     Nose: Congestion and rhinorrhea present.     Mouth/Throat:     Mouth: Mucous membranes are moist.     Pharynx: Posterior oropharyngeal erythema present. No oropharyngeal exudate.  Eyes:     Conjunctiva/sclera: Conjunctivae normal.  Neck:     Vascular: No carotid bruit.  Cardiovascular:     Rate and Rhythm: Normal rate and regular rhythm.  Pulmonary:     Effort: Pulmonary effort is normal.     Breath sounds: Normal breath sounds.  Musculoskeletal:     Cervical back: Normal range of motion and neck supple. No rigidity or tenderness.  Lymphadenopathy:     Cervical: No cervical adenopathy.  Neurological:     Mental Status: She is alert.  Psychiatric:        Mood and Affect: Mood normal.          Assessment & Plan:  Marland KitchenMarland KitchenLeslieann was seen today for sinus problem.  Diagnoses and all orders for this visit:  Acute bacterial sinusitis -     azithromycin (ZITHROMAX Z-PAK) 250 MG  tablet; Take 2 tablets (500 mg) on  Day 1,  followed by 1 tablet (250 mg) once daily on Days 2 through 5. -     methylPREDNISolone (MEDROL DOSEPAK) 4 MG TBPK tablet; Take as directed by package insert.  Yeast vaginitis -     fluconazole (DIFLUCAN) 150 MG tablet; Take one tablet now and then one tablet when finish zpak.   Treated with zpak and medrol dose pack Diflucan for yeast symptoms HO given       Return if symptoms worsen or fail to improve.  Tandy Gaw, PA-C

## 2022-09-29 NOTE — Patient Instructions (Signed)

## 2022-09-30 ENCOUNTER — Encounter: Payer: Self-pay | Admitting: Physician Assistant

## 2022-10-01 ENCOUNTER — Encounter: Payer: Self-pay | Admitting: Physician Assistant

## 2022-10-01 DIAGNOSIS — E782 Mixed hyperlipidemia: Secondary | ICD-10-CM

## 2022-10-01 DIAGNOSIS — Z780 Asymptomatic menopausal state: Secondary | ICD-10-CM

## 2022-10-01 DIAGNOSIS — R5383 Other fatigue: Secondary | ICD-10-CM

## 2022-10-01 DIAGNOSIS — E039 Hypothyroidism, unspecified: Secondary | ICD-10-CM

## 2022-10-01 DIAGNOSIS — E538 Deficiency of other specified B group vitamins: Secondary | ICD-10-CM

## 2022-10-16 ENCOUNTER — Encounter: Payer: Self-pay | Admitting: Physician Assistant

## 2022-10-20 NOTE — Telephone Encounter (Signed)
Letter based on anxiety under stressfull situations and effects her decision making.

## 2022-10-21 NOTE — Telephone Encounter (Signed)
I think Kelly Wheeler might have done letter before she left. Check and let pt know when to pick up.

## 2022-10-22 DIAGNOSIS — R5383 Other fatigue: Secondary | ICD-10-CM | POA: Diagnosis not present

## 2022-10-22 DIAGNOSIS — E538 Deficiency of other specified B group vitamins: Secondary | ICD-10-CM | POA: Diagnosis not present

## 2022-10-22 DIAGNOSIS — E782 Mixed hyperlipidemia: Secondary | ICD-10-CM | POA: Diagnosis not present

## 2022-10-22 DIAGNOSIS — E039 Hypothyroidism, unspecified: Secondary | ICD-10-CM | POA: Diagnosis not present

## 2022-10-23 ENCOUNTER — Other Ambulatory Visit: Payer: Self-pay | Admitting: Physician Assistant

## 2022-10-23 ENCOUNTER — Encounter: Payer: Self-pay | Admitting: Physician Assistant

## 2022-10-23 LAB — TSH+FREE T4
Free T4: 1.39 ng/dL (ref 0.82–1.77)
TSH: 0.32 u[IU]/mL — ABNORMAL LOW (ref 0.450–4.500)

## 2022-10-23 LAB — CMP14+EGFR
ALT: 29 [IU]/L (ref 0–32)
AST: 32 [IU]/L (ref 0–40)
Albumin: 4.1 g/dL (ref 3.9–4.9)
Alkaline Phosphatase: 90 [IU]/L (ref 44–121)
BUN/Creatinine Ratio: 14 (ref 12–28)
BUN: 11 mg/dL (ref 8–27)
Bilirubin Total: 0.6 mg/dL (ref 0.0–1.2)
CO2: 23 mmol/L (ref 20–29)
Calcium: 9.4 mg/dL (ref 8.7–10.3)
Chloride: 106 mmol/L (ref 96–106)
Creatinine, Ser: 0.79 mg/dL (ref 0.57–1.00)
Globulin, Total: 2.4 g/dL (ref 1.5–4.5)
Glucose: 106 mg/dL — ABNORMAL HIGH (ref 70–99)
Potassium: 4.3 mmol/L (ref 3.5–5.2)
Sodium: 143 mmol/L (ref 134–144)
Total Protein: 6.5 g/dL (ref 6.0–8.5)
eGFR: 85 mL/min/{1.73_m2} (ref >59)

## 2022-10-23 LAB — LIPID PANEL
Chol/HDL Ratio: 3.1 {ratio} (ref 0.0–4.4)
Cholesterol, Total: 240 mg/dL — ABNORMAL HIGH (ref 100–199)
HDL: 78 mg/dL (ref >39)
LDL Chol Calc (NIH): 142 mg/dL — ABNORMAL HIGH (ref 0–99)
Triglycerides: 114 mg/dL (ref 0–149)
VLDL Cholesterol Cal: 20 mg/dL (ref 5–40)

## 2022-10-23 LAB — CBC WITH DIFFERENTIAL/PLATELET
Basophils Absolute: 0 10*3/uL (ref 0.0–0.2)
Basos: 0 %
EOS (ABSOLUTE): 0.1 10*3/uL (ref 0.0–0.4)
Eos: 2 %
Hematocrit: 41.9 % (ref 34.0–46.6)
Hemoglobin: 13.3 g/dL (ref 11.1–15.9)
Immature Grans (Abs): 0 10*3/uL (ref 0.0–0.1)
Immature Granulocytes: 0 %
Lymphocytes Absolute: 1.6 10*3/uL (ref 0.7–3.1)
Lymphs: 32 %
MCH: 29.4 pg (ref 26.6–33.0)
MCHC: 31.7 g/dL (ref 31.5–35.7)
MCV: 93 fL (ref 79–97)
Monocytes Absolute: 0.3 10*3/uL (ref 0.1–0.9)
Monocytes: 7 %
Neutrophils Absolute: 2.9 10*3/uL (ref 1.4–7.0)
Neutrophils: 59 %
Platelets: 149 10*3/uL — ABNORMAL LOW (ref 150–450)
RBC: 4.52 x10E6/uL (ref 3.77–5.28)
RDW: 13.2 % (ref 11.7–15.4)
WBC: 5 10*3/uL (ref 3.4–10.8)

## 2022-10-23 LAB — VITAMIN D 25 HYDROXY (VIT D DEFICIENCY, FRACTURES): Vit D, 25-Hydroxy: 33.9 ng/mL (ref 30.0–100.0)

## 2022-10-23 LAB — B12 AND FOLATE PANEL
Folate: 15 ng/mL
Vitamin B-12: 70 pg/mL — ABNORMAL LOW (ref 232–1245)

## 2022-10-23 LAB — HEMOGLOBIN A1C
Est. average glucose Bld gHb Est-mCnc: 123 mg/dL
Hgb A1c MFr Bld: 5.9 % — ABNORMAL HIGH (ref 4.8–5.6)

## 2022-10-23 MED ORDER — SYNTHROID 100 MCG PO TABS: 100.0000 ug | ORAL_TABLET | Freq: Every day | ORAL | 0 refills | Status: DC

## 2022-10-23 NOTE — Progress Notes (Signed)
Kelly Wheeler,   A1C in pre-diabetes range.  To stop progression need to work on limiting sugars and carbs in diet and get regular exercise. Will recheck in 6 months.   LDL not to goal.   ..The 10-year ASCVD risk score (Arnett DK, et al., 2019) is: 3.8%   Values used to calculate the score:     Age: 62 years     Sex: Female     Is Non-Hispanic African American: No     Diabetic: No     Tobacco smoker: No     Systolic Blood Pressure: 128 mmHg     Is BP treated: No     HDL Cholesterol: 78 mg/dL     Total Cholesterol: 240 mg/dL  Your W09 is extremely low need to start monthy b12 shots and recheck in 3 months.   Make sure taking vitamin D at least 1000 units a day with dairy for better absorption.   TSH is not to goal lets decrease synthroid just a little and recheck in 6 months. TSH goal is 1-2.

## 2022-10-23 NOTE — Telephone Encounter (Signed)
Letter written. Placed at front desk for pick up.  Patient informed.

## 2022-10-28 ENCOUNTER — Encounter: Payer: Self-pay | Admitting: Physician Assistant

## 2022-11-02 ENCOUNTER — Encounter: Payer: Self-pay | Admitting: Physician Assistant

## 2022-11-02 ENCOUNTER — Ambulatory Visit (INDEPENDENT_AMBULATORY_CARE_PROVIDER_SITE_OTHER): Payer: BC Managed Care – PPO | Admitting: Physician Assistant

## 2022-11-02 VITALS — BP 132/70 | HR 70 | Ht 62.0 in | Wt 194.0 lb

## 2022-11-02 DIAGNOSIS — E538 Deficiency of other specified B group vitamins: Secondary | ICD-10-CM | POA: Diagnosis not present

## 2022-11-02 DIAGNOSIS — E039 Hypothyroidism, unspecified: Secondary | ICD-10-CM

## 2022-11-02 DIAGNOSIS — R7303 Prediabetes: Secondary | ICD-10-CM | POA: Diagnosis not present

## 2022-11-02 MED ORDER — CYANOCOBALAMIN 1000 MCG/ML IJ SOLN
1000.0000 ug | Freq: Once | INTRAMUSCULAR | Status: AC
Start: 2022-11-02 — End: 2022-11-02
  Administered 2022-11-02: 1000 ug via INTRAMUSCULAR

## 2022-11-02 NOTE — Progress Notes (Unsigned)
   Established Patient Office Visit  Subjective   Patient ID: Kelly Wheeler, female    DOB: 20-Sep-1960  Age: 62 y.o. MRN: [id number]  Chief Complaint  Patient presents with  . discuss recent lab results and startig B12 injections    HPI  {History (Optional):23778}  ROS    Objective:     BP (!) 146/73   Pulse 70   Ht 5\' 2"  (1.575 m)   Wt 194 lb (88 kg)   LMP 05/09/2010   SpO2 98%   BMI 35.48 kg/m  {Vitals History (Optional):23777}  Physical Exam   No results found for any visits on 11/02/22.  {Labs (Optional):23779}  The 10-year ASCVD risk score (Arnett DK, et al., 2019) is: 4.8%    Assessment & Plan:   Problem List Items Addressed This Visit       Unprioritized   B12 deficiency - Primary    No follow-ups on file.    Tandy Gaw, PA-C

## 2022-11-02 NOTE — Patient Instructions (Addendum)
PURE-methylcobalamin liquid drops Recheck labs in 3 months   Vitamin B12 Deficiency Vitamin B12 deficiency occurs when the body does not have enough of this important vitamin. The body needs this vitamin: To make red blood cells. To make DNA. This is the genetic material inside cells. To help the nerves work properly so they can carry messages from the brain to the body. Vitamin B12 deficiency can cause health problems, such as not having enough red blood cells in the blood (anemia). This can lead to nerve damage if untreated. What are the causes? This condition may be caused by: Not eating enough foods that contain vitamin B12. Not having enough stomach acid and digestive fluids to properly absorb vitamin B12 from the food that you eat. Having certain diseases that make it hard to absorb vitamin B12. These diseases include Crohn's disease, chronic pancreatitis, and cystic fibrosis. An autoimmune disorder in which the body does not make enough of a protein (intrinsic factor) within the stomach, resulting in not enough absorption of vitamin B12. Having a surgery in which part of the stomach or small intestine is removed. Taking certain medicines that make it hard for the body to absorb vitamin B12. These include: Heartburn medicines, such as antacids and proton pump inhibitors. Some medicines that are used to treat diabetes. What increases the risk? The following factors may make you more likely to develop a vitamin B12 deficiency: Being an older adult. Eating a vegetarian or vegan diet that does not include any foods that come from animals. Eating a poor diet while you are pregnant. Taking certain medicines. Having alcoholism. What are the signs or symptoms? In some cases, there are no symptoms of this condition. If the condition leads to anemia or nerve damage, various symptoms may occur, such as: Weakness. Tiredness (fatigue). Loss of appetite. Numbness or tingling in your hands and  feet. Redness and burning of the tongue. Depression, confusion, or memory problems. Trouble walking. If anemia is severe, symptoms can include: Shortness of breath. Dizziness. Rapid heart rate. How is this diagnosed? This condition may be diagnosed with a blood test to measure the level of vitamin B12 in your blood. You may also have other tests, including: A group of tests that measure certain characteristics of blood cells (complete blood count, CBC). A blood test to measure intrinsic factor. A procedure where a thin tube with a camera on the end is used to look into your stomach or intestines (endoscopy). Other tests may be needed to discover the cause of the deficiency. How is this treated? Treatment for this condition depends on the cause. This condition may be treated by: Changing your eating and drinking habits, such as: Eating more foods that contain vitamin B12. Drinking less alcohol or no alcohol. Getting vitamin B12 injections. Taking vitamin B12 supplements by mouth (orally). Your health care provider will tell you which dose is best for you. Follow these instructions at home: Eating and drinking  Include foods in your diet that come from animals and contain a lot of vitamin B12. These include: Meats and poultry. This includes beef, pork, chicken, [place], and organ meats, such as liver. Seafood. This includes clams, rainbow trout, salmon, tuna, and haddock. Eggs. Dairy foods such as milk, yogurt, and cheese. Eat foods that have vitamin B12 added to them (are fortified), such as ready-to-eat breakfast cereals. Check the label on the package to see if a food is fortified. The items listed above may not be a complete list of foods and  beverages you can eat and drink. Contact a dietitian for more information. Alcohol use Do not drink alcohol if: Your health care provider tells you not to drink. You are pregnant, may be pregnant, or are planning to become pregnant. If you  drink alcohol: Limit how much you have to: 0-1 drink a day for women. 0-2 drinks a day for men. Know how much alcohol is in your drink. In the [place] one drink equals one 12 oz bottle of beer (355 mL), one 5 oz glass of wine (148 mL), or one 1 oz glass of hard liquor (44 mL). General instructions Get vitamin B12 injections if told to by your health care provider. Take supplements only as told by your health care provider. Follow the directions carefully. Keep all follow-up visits. This is important. Contact a health care provider if: Your symptoms come back. Your symptoms get worse or do not improve with treatment. Get help right away: You develop shortness of breath. You have a rapid heart rate. You have chest pain. You become dizzy or you faint. These symptoms may be an emergency. Get help right away. Call 911. Do not wait to see if the symptoms will go away. Do not drive yourself to the hospital. Summary Vitamin B12 deficiency occurs when the body does not have enough of this important vitamin. Common causes include not eating enough foods that contain vitamin B12, not being able to absorb vitamin B12 from the food that you eat, having a surgery in which part of the stomach or small intestine is removed, or taking certain medicines. Eat foods that have vitamin B12 in them. Treatment may include making a change in the way you eat and drink, getting vitamin B12 injections, or taking vitamin B12 supplements. This information is not intended to replace advice given to you by your health care provider. Make sure you discuss any questions you have with your health care provider. Document Revised: 09/06/2020 Document Reviewed: 09/06/2020 Elsevier Patient Education  2024 [employer].

## 2022-11-03 ENCOUNTER — Encounter: Payer: Self-pay | Admitting: Physician Assistant

## 2022-11-03 DIAGNOSIS — R7303 Prediabetes: Secondary | ICD-10-CM | POA: Insufficient documentation

## 2022-11-16 ENCOUNTER — Encounter: Payer: Self-pay | Admitting: Physician Assistant

## 2022-11-17 NOTE — Telephone Encounter (Signed)
Spoke with patient and checked brand of medication "PURE" found online that one dropper is equivalent to 1,051mcg. Patient informed.

## 2022-11-17 NOTE — Telephone Encounter (Signed)
Left message for a return call

## 2022-11-25 ENCOUNTER — Other Ambulatory Visit: Payer: Self-pay

## 2022-11-25 ENCOUNTER — Ambulatory Visit: Payer: BC Managed Care – PPO

## 2022-11-25 ENCOUNTER — Ambulatory Visit
Admission: RE | Admit: 2022-11-25 | Discharge: 2022-11-25 | Disposition: A | Discharge status: Home / Self Care | Payer: BC Managed Care – PPO | Source: Ambulatory Visit | Attending: Family Medicine | Admitting: Family Medicine

## 2022-11-25 VITALS — BP 147/88 | HR 86 | Temp 97.4°F | Resp 16

## 2022-11-25 DIAGNOSIS — S46911A Strain of unspecified muscle, fascia and tendon at shoulder and upper arm level, right arm, initial encounter: Secondary | ICD-10-CM

## 2022-11-25 DIAGNOSIS — M19011 Primary osteoarthritis, right shoulder: Secondary | ICD-10-CM | POA: Diagnosis not present

## 2022-11-25 DIAGNOSIS — M25511 Pain in right shoulder: Secondary | ICD-10-CM

## 2022-11-25 DIAGNOSIS — W19XXXA Unspecified fall, initial encounter: Secondary | ICD-10-CM

## 2022-11-25 NOTE — ED Triage Notes (Signed)
Monday fell onto right shoulder while playing with grandchildren. Unable to raise arm up.

## 2022-11-25 NOTE — ED Provider Notes (Signed)
[facility]    CSN: [id number] Arrival date & time: 11/25/22  1733      History   Chief Complaint Chief Complaint  Patient presents with   Shoulder Injury    HPI Kelly Wheeler is a 62 y.o. female.   Patient fell onto her right side when she was playing with her grandchildren on Monday.  It is now Wednesday.  She states that she has pain in her right shoulder.  She has trouble moving her shoulder due to the pain.  She states she is lifting her right shoulder with her left arm.  She has never had shoulder problems before.  She felt a pop when she fell.  She has not taken any medication.  She tried heat last night but it made it worse.  She is having trouble sleeping due to the pain    History reviewed. No pertinent past medical history.  Patient Active Problem List   Diagnosis Date Noted   Pre-diabetes 11/03/2022   Trouble in sleeping 05/23/2021   Elevated LDL cholesterol level 05/21/2021   Elevated blood pressure reading 05/20/2021   History of abnormal cervical Papanicolaou smear 05/19/2021   Vitamin D deficiency 11/07/2018   Sinus drainage 04/15/2018   Seasonal allergies 04/15/2018   Class 1 obesity due to excess calories without serious comorbidity with body mass index (BMI) of 33.0 to 33.9 in adult 09/25/2017   Hot flashes 01/02/2017   B12 deficiency 01/02/2017   Overweight (BMI 25.0-29.9) 01/01/2017   Anterior cruciate ligament complete tear, left, initial encounter 12/17/2015   Elevated fasting glucose 02/19/2015   Hyperlipidemia 03/28/2014   Elevated liver enzymes 03/28/2014   Menopausal symptoms 09/12/2013   Hypothyroidism 12/09/2009    Past Surgical History:  Procedure Laterality Date   cyst removed under Right breast  1-11   TUBAL LIGATION      OB History   No obstetric history on file.      Home Medications    Prior to Admission medications   Medication Sig Start Date End Date Taking? Authorizing Provider  cyanocobalamin (VITAMIN  B12) 500 MCG tablet Take 500 mcg by mouth daily.   Yes [provider]  SYNTHROID 100 MCG tablet Take 1 tablet (100 mcg total) by mouth daily before breakfast. 10/23/22   Breeback, Jade L, PA-C  Vitamin D, Ergocalciferol, (DRISDOL) 1.25 MG (50000 UNIT) CAPS capsule TAKE 1 CAPSULE (50,000 UNITS TOTAL) BY MOUTH EVERY 7 (SEVEN) DAYS 10/01/22   Jomarie Longs, PA-C    Family History Family History  Problem Relation Age of Onset   Cancer Mother        Lung    Social History Social History   Tobacco Use   Smoking status: Former    Current packs/day: 0.00    Types: Cigarettes    Quit date: 01/26/1982    Years since quitting: 40.8   Smokeless tobacco: Never  Substance Use Topics   Alcohol use: No   Drug use: No     Allergies   Rofecoxib and Augmentin [amoxicillin-pot clavulanate]   Review of Systems Review of Systems  See HPI Physical Exam Triage Vital Signs ED Triage Vitals  Encounter Vitals Group     BP 11/25/22 1744 (!) 147/88     Systolic BP Percentile --      Diastolic BP Percentile --      Pulse Rate 11/25/22 1744 86     Resp 11/25/22 1744 16     Temp 11/25/22 1744 (!) 97.4  F (36.3 C)     Temp Source 11/25/22 1744 Oral     SpO2 11/25/22 1744 100 %     Weight --      Height --      Head Circumference --      Peak Flow --      Pain Score 11/25/22 1747 2     Pain Loc --      Pain Education --      Exclude from Growth Chart --    No data found.  Updated Vital Signs BP (!) 147/88   Pulse 86   Temp (!) 97.4 F (36.3 C) (Oral)   Resp 16   LMP 05/09/2010   SpO2 100%      Physical Exam Constitutional:      General: She is not in acute distress.    Appearance: She is well-developed and normal weight.  HENT:     Head: Normocephalic and atraumatic.  Eyes:     Conjunctiva/sclera: Conjunctivae normal.     Pupils: Pupils are equal, round, and reactive to light.  Cardiovascular:     Rate and Rhythm: Normal rate.  Pulmonary:     Effort: Pulmonary  effort is normal. No respiratory distress.  Abdominal:     General: There is no distension.     Palpations: Abdomen is soft.  Musculoskeletal:        General: Tenderness and signs of injury present. No swelling or deformity. Normal range of motion.     Cervical back: Normal range of motion.     Comments: Mild tenderness in the right shoulder anteriorly.  Patient can abduct only 15 degrees.  She can forward extend 20 to 30 degrees.  She has very limited rotation.  Skin:    General: Skin is warm and dry.  Neurological:     Mental Status: She is alert.      UC Treatments / Results  Labs (all labs ordered are listed, but only abnormal results are displayed) Labs Reviewed - No data to display  EKG   Radiology No results found.  Procedures Procedures (including critical care time)  Medications Ordered in UC Medications - No data to display  Initial Impression / Assessment and Plan / UC Course  I have reviewed the triage vital signs and the nursing notes.  Pertinent labs & imaging results that were available during my care of the patient were reviewed by me and considered in my medical decision making (see chart for details).     X-ray is delayed today.  I discharged her in the absence of a radiology reading.  To my review there is no fracture.  She was offered a sling and declines.  I physically demonstrated to her how to do pendulum exercises and wall walking.  Recommend Advil.  See Dr. Karie Schwalbe next week Final Clinical Impressions(s) / UC Diagnoses   Final diagnoses:  Shoulder strain, right, initial encounter  Fall, initial encounter     Discharge Instructions      Take a couple Advil at bedtime Remember to do the exercises twice a day, swinging your arm like a pendulum and wall walking See Dr. Karie Schwalbe in follow-up next week   ED Prescriptions   None    PDMP not reviewed this encounter.   Eustace Moore, MD 11/25/22 1910

## 2022-11-25 NOTE — Discharge Instructions (Signed)
Take a couple Advil at bedtime Remember to do the exercises twice a day, swinging your arm like a pendulum and wall walking See Dr. Karie Schwalbe in follow-up next week

## 2022-12-02 ENCOUNTER — Encounter: Payer: Self-pay | Admitting: Sports Medicine

## 2022-12-02 ENCOUNTER — Ambulatory Visit: Payer: BC Managed Care – PPO | Admitting: Sports Medicine

## 2022-12-02 DIAGNOSIS — S46011A Strain of muscle(s) and tendon(s) of the rotator cuff of right shoulder, initial encounter: Secondary | ICD-10-CM | POA: Diagnosis not present

## 2022-12-02 NOTE — Progress Notes (Signed)
    Procedures performed today:    None.  Independent interpretation of notes and tests performed by another provider:   X-rays personally viewed, potential step-off at the greater tuberosity, there are acromioclavicular degenerative changes.  Brief History, Exam, Impression, and Recommendations:    Traumatic tear of right rotator cuff This is a very pleasant 62 year old female, she was playing baseball with her kids about a week and a half ago and fell directly into her shoulder, she had immediate pain and inability to Abduct. She was seen in urgent care, x-rays were done that were read as negative but on my personal review do show a potential step-off at the greater tuberosity, she is here for follow-up. She has some improvement in pain but she still has vast difficulty with range of motion. She has profound weakness to abduction, profound weakness to the speeds test, she has a positive painful arc, she also has positive impingement signs such as Neer and Hawkins. I do suspect she has had a traumatic rotator cuff tear and likely tear of the long head of the biceps. Due to the acuity of the injury I do think we should proceed with MRI. Return to see me to go over MRI results.    ____________________________________________ Ihor Austin. Benjamin Stain, M.D., ABFM., CAQSM., AME. Primary Care and Sports Medicine [facility]  Adjunct Professor of Family Medicine  [place] [employer] [facility] [employer]  Restaurant manager, fast food

## 2022-12-02 NOTE — Assessment & Plan Note (Addendum)
This is a very pleasant 62 year old female, she was playing baseball with her kids about a week and a half ago and fell directly into her shoulder, she had immediate pain and inability to Abduct. She was seen in urgent care, x-rays were done that were read as negative but on my personal review do show a potential step-off at the greater tuberosity, she is here for follow-up. She has some improvement in pain but she still has vast difficulty with range of motion. She has profound weakness to abduction, profound weakness to the speeds test, she has a positive painful arc, she also has positive impingement signs such as Neer and Hawkins. I do suspect she has had a traumatic rotator cuff tear and likely tear of the long head of the biceps. Due to the acuity of the injury I do think we should proceed with MRI. Return to see me to go over MRI results.

## 2022-12-08 ENCOUNTER — Ambulatory Visit: Payer: BC Managed Care – PPO | Admitting: Sports Medicine

## 2022-12-14 ENCOUNTER — Ambulatory Visit (INDEPENDENT_AMBULATORY_CARE_PROVIDER_SITE_OTHER): Payer: BC Managed Care – PPO

## 2022-12-14 DIAGNOSIS — S46811D Strain of other muscles, fascia and tendons at shoulder and upper arm level, right arm, subsequent encounter: Secondary | ICD-10-CM | POA: Diagnosis not present

## 2022-12-14 DIAGNOSIS — S46011A Strain of muscle(s) and tendon(s) of the rotator cuff of right shoulder, initial encounter: Secondary | ICD-10-CM | POA: Diagnosis not present

## 2022-12-14 DIAGNOSIS — M129 Arthropathy, unspecified: Secondary | ICD-10-CM | POA: Diagnosis not present

## 2022-12-14 DIAGNOSIS — M25711 Osteophyte, right shoulder: Secondary | ICD-10-CM | POA: Diagnosis not present

## 2022-12-14 DIAGNOSIS — M25511 Pain in right shoulder: Secondary | ICD-10-CM | POA: Diagnosis not present

## 2022-12-29 ENCOUNTER — Encounter (INDEPENDENT_AMBULATORY_CARE_PROVIDER_SITE_OTHER): Payer: BC Managed Care – PPO | Admitting: Sports Medicine

## 2022-12-29 DIAGNOSIS — S46011D Strain of muscle(s) and tendon(s) of the rotator cuff of right shoulder, subsequent encounter: Secondary | ICD-10-CM | POA: Diagnosis not present

## 2022-12-30 NOTE — Telephone Encounter (Signed)

## 2022-12-31 ENCOUNTER — Encounter: Payer: Self-pay | Admitting: Sports Medicine

## 2022-12-31 ENCOUNTER — Ambulatory Visit: Payer: BC Managed Care – PPO | Admitting: Sports Medicine

## 2022-12-31 DIAGNOSIS — S46011D Strain of muscle(s) and tendon(s) of the rotator cuff of right shoulder, subsequent encounter: Secondary | ICD-10-CM

## 2022-12-31 NOTE — Progress Notes (Signed)
    Procedures performed today:    None.  Independent interpretation of notes and tests performed by another provider:   None.  Brief History, Exam, Impression, and Recommendations:    Traumatic tear of right rotator cuff Kelly Wheeler returns, she is a very pleasant 63 year old female, she was playing baseball with her kids and fell directly onto her shoulder sometime late October. Unfortunately she had immediate pain and inability to abduct. She was seen in urgent care and x-rays were obtained that were read as negative, I had concern for greater tuberosity fracture and rotator cuff tear so we obtained an MRI, the MRI did show a partial tearing with retraction of the supraspinatus, tendinosis of other parts of the rotator cuff, bicipital tendinosis, as well as an inferiorly directed acromial spur. She has improved dramatically since her MRI, she has good motion, good strength, very little pain. We discussed avoiding throwing and jerking type motions. We also discussed the anatomy and function of the rotator cuff. We are going to do some aggressive home physical therapy as her co-pay for formal PT is too expensive. She will do this for about 6 weeks before considering injection +/- surgical consult.    ____________________________________________ Ihor Austin. Benjamin Stain, M.D., ABFM., CAQSM., AME. Primary Care and Sports Medicine [facility]  Adjunct Professor of Family Medicine  [place] [employer] [facility]  Restaurant manager, fast food

## 2022-12-31 NOTE — Assessment & Plan Note (Addendum)
Kelly Wheeler returns, she is a very pleasant 62 year old female, she was playing baseball with her kids and fell directly onto her shoulder sometime late October. Unfortunately she had immediate pain and inability to abduct. She was seen in urgent care and x-rays were obtained that were read as negative, I had concern for greater tuberosity fracture and rotator cuff tear so we obtained an MRI, the MRI did show a partial tearing with retraction of the supraspinatus, tendinosis of other parts of the rotator cuff, bicipital tendinosis, as well as an inferiorly directed acromial spur. She has improved dramatically since her MRI, she has good motion, good strength, very little pain. We discussed avoiding throwing and jerking type motions. We also discussed the anatomy and function of the rotator cuff. We are going to do some aggressive home physical therapy as her co-pay for formal PT is too expensive. She will do this for about 6 weeks before considering injection +/- surgical consult.

## 2023-01-05 ENCOUNTER — Encounter (INDEPENDENT_AMBULATORY_CARE_PROVIDER_SITE_OTHER): Payer: BC Managed Care – PPO | Admitting: Sports Medicine

## 2023-01-05 DIAGNOSIS — S46011D Strain of muscle(s) and tendon(s) of the rotator cuff of right shoulder, subsequent encounter: Secondary | ICD-10-CM

## 2023-01-05 NOTE — Telephone Encounter (Signed)

## 2023-01-07 ENCOUNTER — Other Ambulatory Visit: Payer: Self-pay

## 2023-01-07 ENCOUNTER — Ambulatory Visit: Payer: BC Managed Care – PPO | Attending: Sports Medicine

## 2023-01-07 DIAGNOSIS — S46011D Strain of muscle(s) and tendon(s) of the rotator cuff of right shoulder, subsequent encounter: Secondary | ICD-10-CM | POA: Insufficient documentation

## 2023-01-07 DIAGNOSIS — M6281 Muscle weakness (generalized): Secondary | ICD-10-CM | POA: Insufficient documentation

## 2023-01-07 DIAGNOSIS — M25511 Pain in right shoulder: Secondary | ICD-10-CM | POA: Insufficient documentation

## 2023-01-07 DIAGNOSIS — M25611 Stiffness of right shoulder, not elsewhere classified: Secondary | ICD-10-CM | POA: Insufficient documentation

## 2023-01-07 DIAGNOSIS — X58XXXD Exposure to other specified factors, subsequent encounter: Secondary | ICD-10-CM | POA: Diagnosis not present

## 2023-01-07 NOTE — Therapy (Signed)
OUTPATIENT PHYSICAL THERAPY UPPER EXTREMITY EVALUATION   Patient Name: Kelly Wheeler MRN: [id number] DOB:1960-06-15, 62 y.o., female Today's Date: 01/07/2023  END OF SESSION:  PT End of Session - 01/07/23 1145     Visit Number 1    Number of Visits 9    Date for PT Re-Evaluation 03/06/23    Authorization Type BCBS    PT Start Time 1147    PT Stop Time 1225    PT Time Calculation (min) 38 min    Activity Tolerance Patient tolerated treatment well    Behavior During Therapy [facility] for tasks assessed/performed             History reviewed. No pertinent past medical history. Past Surgical History:  Procedure Laterality Date   cyst removed under Right breast  1-11   TUBAL LIGATION     Patient Active Problem List   Diagnosis Date Noted   Traumatic tear of right rotator cuff 12/02/2022   Pre-diabetes 11/03/2022   Trouble in sleeping 05/23/2021   Elevated LDL cholesterol level 05/21/2021   Elevated blood pressure reading 05/20/2021   History of abnormal cervical Papanicolaou smear 05/19/2021   Vitamin D deficiency 11/07/2018   Sinus drainage 04/15/2018   Seasonal allergies 04/15/2018   Class 1 obesity due to excess calories without serious comorbidity with body mass index (BMI) of 33.0 to 33.9 in adult 09/25/2017   Hot flashes 01/02/2017   B12 deficiency 01/02/2017   Overweight (BMI 25.0-29.9) 01/01/2017   Anterior cruciate ligament complete tear, left, initial encounter 12/17/2015   Elevated fasting glucose 02/19/2015   Hyperlipidemia 03/28/2014   Elevated liver enzymes 03/28/2014   Menopausal symptoms 09/12/2013   Hypothyroidism 12/09/2009    PCP: Jomarie Longs, PA-C  REFERRING PROVIDER: Monica Becton, MD   REFERRING DIAG: S46.011D (ICD-10-CM) - Traumatic tear of right rotator cuff, unspecified tear extent, subsequent encounter   THERAPY DIAG:  Acute pain of right shoulder  Stiffness of right shoulder, not elsewhere classified  Muscle weakness  (generalized)  Rationale for Evaluation and Treatment: Rehabilitation  ONSET DATE: 11/23/22  SUBJECTIVE:                                                                                                                                                                                      SUBJECTIVE STATEMENT: Patient reports injuring the Rt shoulder on 11/23/22 when she was playing ball with her grandchild running to a base and reached out to touch it and lost her balance and fell on her Rt shoulder. She reported it was painful, but she still kept playing ball. The pain worsened and then she went to urgent care 2  days later. She saw Dr. Karie Schwalbe and had an MRI that showed a partial tear in her rotator cuff. She reports overall the pain is improving, but has the most pain first thing in the morning and with reaching. The more she moves it the better it feels. She received exercises from Dr. Karie Schwalbe, but did not feel she was doing them correctly and it was causing her neck to her hurt.   Hand dominance:  writes with Lt hand, but does other activities with the Rt hand  PERTINENT HISTORY: None   PAIN:  Are you having pain? Yes: NPRS scale: 1 currently; at worst 5/10 Pain location: Rt lateral shoulder Pain description: dull, ache Aggravating factors: reaching out to the side, first waking in AM, sleeping Relieving factors: movement  PRECAUTIONS: None  RED FLAGS: None   WEIGHT BEARING RESTRICTIONS: No  FALLS:  Has patient fallen in last 6 months? Yes. Number of falls 1 fell running to the base while playing ball and lost her balance  LIVING ENVIRONMENT: Lives with: lives with their spouse Lives in: House/apartment Stairs: Yes: External: 2 steps; can reach both Has following equipment at home: None  OCCUPATION: Medical receptionist   PLOF: Independent  PATIENT GOALS: learn exercises to do to help with pain and mobility   NEXT MD VISIT: 02/16/23  OBJECTIVE:  Note: Objective measures were  completed at Evaluation unless otherwise noted.  DIAGNOSTIC FINDINGS:  IMPRESSION: 1. Moderate supraspinatus tendinosis with a small partial-thickness bursal surface tear along the anterior-most aspect and a delamination type partial-thickness articular surface tear with 8.5 mm of retraction of the torn fibers involving the more posterior aspect as well as a small full-thickness component oriented obliquely. 2. Moderate infraspinatus tendinosis. 3. Mild subscapularis tendinosis with a small partial-thickness tear and medial subluxation of the long head of the biceps tendon from the bicipital groove. 4. Moderate-severe arthropathy of the acromioclavicular joint with a inferiorly directed osteophyte. 5. Moderate subacromial/subdeltoid bursitis.  PATIENT SURVEYS :  FOTO 68% function to 71% predicted   COGNITION: Overall cognitive status: Within functional limits for tasks assessed     SENSATION: Not tested  POSTURE: Forward head/rounded shoulders   UPPER EXTREMITY ROM:   Active ROM Right eval Left eval  Shoulder flexion 140 pain; shoulder shrug >90; standing   Shoulder extension    Shoulder abduction 91 pain; standing    Shoulder adduction    Shoulder internal rotation 72 90  Shoulder external rotation 67 pain 86  Elbow flexion    Elbow extension    Wrist flexion    Wrist extension    Wrist ulnar deviation    Wrist radial deviation    Wrist pronation    Wrist supination    (Blank rows = not tested)  UPPER EXTREMITY MMT:  MMT Right eval Left eval  Shoulder flexion 4- pain; shoulder shrug 4+  Shoulder extension    Shoulder abduction 4 pain; shoulder shrug 5  Shoulder adduction    Shoulder internal rotation 5 5  Shoulder external rotation 4 pain 5  Middle trapezius    Lower trapezius    Elbow flexion    Elbow extension    Wrist flexion    Wrist extension    Wrist ulnar deviation    Wrist radial deviation    Wrist pronation    Wrist supination    Grip  strength (lbs)    (Blank rows = not tested)  SHOULDER SPECIAL TESTS: Not tested  JOINT MOBILITY TESTING:  Not tested  PALPATION:  TTP Rt greater tubercle, supraspinatus, upper trap    [facility] Adult PT Treatment:                                                DATE: 01/07/23 Therapeutic Exercise: Demonstrated, performed and issued initial HEP.      PATIENT EDUCATION: Education details: see treatment; POC; assessment findings  Person educated: Patient Education method: Explanation, Demonstration, Tactile cues, Verbal cues, and Handouts Education comprehension: verbalized understanding, returned demonstration, verbal cues required, tactile cues required, and needs further education  HOME EXERCISE PROGRAM: Access Code: [email or web address] URL: [email or web address] Date: 01/07/2023 Prepared by: Letitia Libra  Exercises - Supine Shoulder Flexion Extension AAROM with Dowel  - 1 x daily - 7 x weekly - 1 sets - 10 reps - 5 sec  hold - Supine Shoulder Abduction AAROM with Dowel  - 1 x daily - 7 x weekly - 1 sets - 10 reps - 5 sec  hold - Supine Shoulder External Rotation in 45 Degrees Abduction AAROM with Dowel  - 1 x daily - 7 x weekly - 1 sets - 10 reps - 5 sec  hold - Standing Shoulder Row with Anchored Resistance  - 1 x daily - 7 x weekly - 2 sets - 10 reps - Standing Isometric Shoulder External Rotation with Doorway  - 1 x daily - 7 x weekly - 2 sets - 10 reps - 5 sec  hold  ASSESSMENT:  CLINICAL IMPRESSION: Patient is a 62 y.o. female who was seen today for physical therapy evaluation and treatment for Rt shoulder pain secondary to fall in October 2024. MRI revealed partial tearing with retraction of the supraspinatus, bicipital tendinosis, and tendinosis of other rotator cuff musculature (see above for specifics). Overall her shoulder pain and mobility have improved since onset with patient reporting reaching activity is the most aggravating. She was trying to complete home  exercise program issued by referring provider, but feels that she needed more guidance/progression in the PT setting. Upon assessment she is noted to have limited and painful Rt shoulder AROM and weakness/pain with majority of MMT with exception of IR. She will benefit from skilled PT to address Rt shoulder ROM and strength deficits in order to optimize her function and assist in overall pain reduction.    OBJECTIVE IMPAIRMENTS: decreased activity tolerance, decreased mobility, decreased ROM, decreased strength, impaired flexibility, impaired UE functional use, improper body mechanics, postural dysfunction, and pain.   ACTIVITY LIMITATIONS: carrying, lifting, sleeping, dressing, reach over head, and hygiene/grooming  PARTICIPATION LIMITATIONS: meal prep, cleaning, laundry, shopping, and community activity  PERSONAL FACTORS: Age, Fitness, Profession, and Time since onset of injury/illness/exacerbation are also affecting patient's functional outcome.   REHAB POTENTIAL: Good  CLINICAL DECISION MAKING: Stable/uncomplicated  EVALUATION COMPLEXITY: Low  GOALS: Goals reviewed with patient? Yes  SHORT TERM GOALS: Target date: 02/04/2023    Patient will be independent and compliant with initial HEP.   Baseline: issued at eval  Goal status: INITIAL  2.  Patient will demonstrate at least 80 degrees of Rt shoulder ER/IR AROM to improve ability to complete self-care activities.  Baseline: see above Goal status: INITIAL  3.  Patient will demonstrate at least 120 degrees of Rt shoulder flexion/abduction AROM without shoulder shrug to improve ability to complete reaching activity.  Baseline: see above Goal status: INITIAL   LONG TERM  GOALS: Target date: 03/06/23  Patient will score at least 71% on FOTO to signify clinically meaningful improvement in functional abilities.   Baseline: see above Goal status: INITIAL  2.  Patient will demonstrate at least 4+/5 Rt shoulder strength to improve  ability to lift and carry items.  Baseline: see above Goal status: INITIAL  3.  Patient will report pain at worst rated as </=2/10 to improve sleep quality.  Baseline: 5 Goal status: INITIAL  4.  Patient will be independent with advanced home program to progress/maintain current level of function.  Baseline: initial HEP issued  Goal status: INITIAL   PLAN: PT FREQUENCY: 1x/week  PT DURATION: 8 weeks  PLANNED INTERVENTIONS: 97164- PT Re-evaluation, 97110-Therapeutic exercises, 97530- Therapeutic activity, [id number]- Neuromuscular re-education, 97535- Self Care, [id number]- Manual therapy, [id number]- Aquatic Therapy, 97014- Electrical stimulation (unattended), [id number]- Electrical stimulation (manual), 97016- Vasopneumatic device, [id number]- Ionotophoresis 4mg /ml Dexamethasone, Dry Needling, Joint mobilization, Cryotherapy, and Moist heat  PLAN FOR NEXT SESSION: review and progress HEP prn; shoulder AAROM to AROM as tolerated; postural strengthening (working on avoid upper trap engagement), progress rotator cuff strengthening as tolerated (consider reactive isometrics); assess GH joint mobility and treat as needed.   Letitia Libra, PT, DPT, ATC 01/07/23 12:51 PM

## 2023-01-14 ENCOUNTER — Ambulatory Visit: Payer: BC Managed Care – PPO

## 2023-01-14 DIAGNOSIS — M6281 Muscle weakness (generalized): Secondary | ICD-10-CM | POA: Diagnosis not present

## 2023-01-14 DIAGNOSIS — M25511 Pain in right shoulder: Secondary | ICD-10-CM | POA: Diagnosis not present

## 2023-01-14 DIAGNOSIS — M25611 Stiffness of right shoulder, not elsewhere classified: Secondary | ICD-10-CM | POA: Diagnosis not present

## 2023-01-14 DIAGNOSIS — S46011D Strain of muscle(s) and tendon(s) of the rotator cuff of right shoulder, subsequent encounter: Secondary | ICD-10-CM | POA: Diagnosis not present

## 2023-01-14 DIAGNOSIS — X58XXXD Exposure to other specified factors, subsequent encounter: Secondary | ICD-10-CM | POA: Diagnosis not present

## 2023-01-14 NOTE — Therapy (Signed)
OUTPATIENT PHYSICAL THERAPY UPPER EXTREMITY TREATMENT NOTE   Patient Name: Kelly Wheeler MRN: [id number] DOB:15-Mar-1960, 62 y.o., female Today's Date: 01/14/2023  END OF SESSION:  PT End of Session - 01/14/23 1244     Visit Number 2    Number of Visits 9    Date for PT Re-Evaluation 03/06/23    Authorization Type BCBS    PT Start Time 0802    PT Stop Time 0844    PT Time Calculation (min) 42 min    Activity Tolerance Patient tolerated treatment well    Behavior During Therapy [facility] for tasks assessed/performed              History reviewed. No pertinent past medical history. Past Surgical History:  Procedure Laterality Date   cyst removed under Right breast  1-11   TUBAL LIGATION     Patient Active Problem List   Diagnosis Date Noted   Traumatic tear of right rotator cuff 12/02/2022   Pre-diabetes 11/03/2022   Trouble in sleeping 05/23/2021   Elevated LDL cholesterol level 05/21/2021   Elevated blood pressure reading 05/20/2021   History of abnormal cervical Papanicolaou smear 05/19/2021   Vitamin D deficiency 11/07/2018   Sinus drainage 04/15/2018   Seasonal allergies 04/15/2018   Class 1 obesity due to excess calories without serious comorbidity with body mass index (BMI) of 33.0 to 33.9 in adult 09/25/2017   Hot flashes 01/02/2017   B12 deficiency 01/02/2017   Overweight (BMI 25.0-29.9) 01/01/2017   Anterior cruciate ligament complete tear, left, initial encounter 12/17/2015   Elevated fasting glucose 02/19/2015   Hyperlipidemia 03/28/2014   Elevated liver enzymes 03/28/2014   Menopausal symptoms 09/12/2013   Hypothyroidism 12/09/2009    PCP: Jomarie Longs, PA-C  REFERRING PROVIDER: Monica Becton, MD   REFERRING DIAG: S46.011D (ICD-10-CM) - Traumatic tear of right rotator cuff, unspecified tear extent, subsequent encounter   THERAPY DIAG:  Acute pain of right shoulder  Stiffness of right shoulder, not elsewhere classified  Muscle  weakness (generalized)  Rationale for Evaluation and Treatment: Rehabilitation  ONSET DATE: 11/23/22  SUBJECTIVE:                                                                                                                                                                                      SUBJECTIVE STATEMENT: Patient reports R shoulder is doing better. She is still having some pain but not nearly as much as she was before. When she moves the R shoulder, she feels like the shoulder itself moves and pops. The patient has been working on her home exercises and would like to review the dowel exercises today. She  also pointed to the region of the R levator scapulae insertion as an area she has been having pain.   From eval: Patient reports injuring the Rt shoulder on 11/23/22 when she was playing ball with her grandchild running to a base and reached out to touch it and lost her balance and fell on her Rt shoulder. She reported it was painful, but she still kept playing ball. The pain worsened and then she went to urgent care 2 days later. She saw Dr. Karie Schwalbe and had an MRI that showed a partial tear in her rotator cuff. She reports overall the pain is improving, but has the most pain first thing in the morning and with reaching. The more she moves it the better it feels. She received exercises from Dr. Karie Schwalbe, but did not feel she was doing them correctly and it was causing her neck to her hurt.   Hand dominance:  writes with Lt hand, but does other activities with the Rt hand  PERTINENT HISTORY: None   PAIN:  Are you having pain? Yes: NPRS scale: 1 currently; at worst 5/10 Pain location: Rt lateral shoulder Pain description: dull, ache Aggravating factors: reaching out to the side, first waking in AM, sleeping Relieving factors: movement  PRECAUTIONS: None  RED FLAGS: None   WEIGHT BEARING RESTRICTIONS: No  FALLS:  Has patient fallen in last 6 months? Yes. Number of falls 1 fell running to  the [place] while playing ball and lost her balance  LIVING ENVIRONMENT: Lives with: lives with their spouse Lives in: House/apartment Stairs: Yes: External: 2 steps; can reach both Has following equipment at home: None  OCCUPATION: Medical receptionist   PLOF: Independent  PATIENT GOALS: learn exercises to do to help with pain and mobility   NEXT MD VISIT: 02/16/23  OBJECTIVE:  Note: Objective measures were completed at Evaluation unless otherwise noted.  DIAGNOSTIC FINDINGS:  IMPRESSION: 1. Moderate supraspinatus tendinosis with a small partial-thickness bursal surface tear along the anterior-most aspect and a delamination type partial-thickness articular surface tear with 8.5 mm of retraction of the torn fibers involving the more posterior aspect as well as a small full-thickness component oriented obliquely. 2. Moderate infraspinatus tendinosis. 3. Mild subscapularis tendinosis with a small partial-thickness tear and medial subluxation of the long head of the biceps tendon from the bicipital groove. 4. Moderate-severe arthropathy of the acromioclavicular joint with a inferiorly directed osteophyte. 5. Moderate subacromial/subdeltoid bursitis.  PATIENT SURVEYS :  FOTO 68% function to 71% predicted   COGNITION: Overall cognitive status: Within functional limits for tasks assessed     SENSATION: Not tested  POSTURE: Forward head/rounded shoulders   UPPER EXTREMITY ROM:   Active ROM Right eval Left eval  Shoulder flexion 140 pain; shoulder shrug >90; standing   Shoulder extension    Shoulder abduction 91 pain; standing    Shoulder adduction    Shoulder internal rotation 72 90  Shoulder external rotation 67 pain 86  Elbow flexion    Elbow extension    Wrist flexion    Wrist extension    Wrist ulnar deviation    Wrist radial deviation    Wrist pronation    Wrist supination    (Blank rows = not tested)  UPPER EXTREMITY MMT:  MMT Right eval Left eval   Shoulder flexion 4- pain; shoulder shrug 4+  Shoulder extension    Shoulder abduction 4 pain; shoulder shrug 5  Shoulder adduction    Shoulder internal rotation 5 5  Shoulder external rotation  4 pain 5  Middle trapezius    Lower trapezius    Elbow flexion    Elbow extension    Wrist flexion    Wrist extension    Wrist ulnar deviation    Wrist radial deviation    Wrist pronation    Wrist supination    Grip strength (lbs)    (Blank rows = not tested)  SHOULDER SPECIAL TESTS: Not tested  JOINT MOBILITY TESTING:  Not tested  PALPATION:  TTP Rt greater tubercle, supraspinatus, upper trap    [facility] Adult PT Treatment:                                                DATE: 01/14/2023 Therapeutic Exercise: Supine Dowel external rotation AAROM, x 30 rps Dowel abduction AAROM, x 30 reps Dowel flexion AAROM, x 30 reps Seated Flies (elbows together in front of body at shoulder height) maintaining scapular retraction, x 20 reps Prayer position (hands clasped together at head height) flies (bringing elbows together) maintaining scapular retraction, x 20 reps Manual Therapy: Supine: Soft tissue mobilization to subscapularis, levator scapulae, infraspinatus AP, inferior, PA, distraction glides to R shoulder near end range abduction  [facility] Adult PT Treatment:                                                DATE: 01/07/23 Therapeutic Exercise: Demonstrated, performed and issued initial HEP.   PATIENT EDUCATION: Education details: see treatment; POC; assessment findings  Person educated: Patient Education method: Explanation, Demonstration, Tactile cues, Verbal cues, and Handouts Education comprehension: verbalized understanding, returned demonstration, verbal cues required, tactile cues required, and needs further education  HOME EXERCISE PROGRAM: Access Code: [email or web address] URL: [email or web address] Date: 01/07/2023 Prepared by: Letitia Libra  Exercises - Supine  Shoulder Flexion Extension AAROM with Dowel  - 1 x daily - 7 x weekly - 1 sets - 10 reps - 5 sec  hold - Supine Shoulder Abduction AAROM with Dowel  - 1 x daily - 7 x weekly - 1 sets - 10 reps - 5 sec  hold - Supine Shoulder External Rotation in 45 Degrees Abduction AAROM with Dowel  - 1 x daily - 7 x weekly - 1 sets - 10 reps - 5 sec  hold - Standing Shoulder Row with Anchored Resistance  - 1 x daily - 7 x weekly - 2 sets - 10 reps - Standing Isometric Shoulder External Rotation with Doorway  - 1 x daily - 7 x weekly - 2 sets - 10 reps - 5 sec  hold  ASSESSMENT:  CLINICAL IMPRESSION: Patient progressing well with plan of care with notable reduction in symptoms following the first visit. R shoulder abduction remains the most limited, with end range pain into flexion, external rotation, and internal rotation. Manual interventions improved R shoulder abduction ROM but did not change end range pain. Physical therapy remains indicated to further progress R shoulder ROM and function.  From eval:  Patient is a 62 y.o. female who was seen today for physical therapy evaluation and treatment for Rt shoulder pain secondary to fall in October 2024. MRI revealed partial tearing with retraction of the supraspinatus, bicipital tendinosis, and tendinosis of other rotator cuff musculature (see above for  specifics). Overall her shoulder pain and mobility have improved since onset with patient reporting reaching activity is the most aggravating. She was trying to complete home exercise program issued by referring provider, but feels that she needed more guidance/progression in the PT setting. Upon assessment she is noted to have limited and painful Rt shoulder AROM and weakness/pain with majority of MMT with exception of IR. She will benefit from skilled PT to address Rt shoulder ROM and strength deficits in order to optimize her function and assist in overall pain reduction.   OBJECTIVE IMPAIRMENTS: decreased activity  tolerance, decreased mobility, decreased ROM, decreased strength, impaired flexibility, impaired UE functional use, improper body mechanics, postural dysfunction, and pain.   ACTIVITY LIMITATIONS: carrying, lifting, sleeping, dressing, reach over head, and hygiene/grooming  PARTICIPATION LIMITATIONS: meal prep, cleaning, laundry, shopping, and community activity  PERSONAL FACTORS: Age, Fitness, Profession, and Time since onset of injury/illness/exacerbation are also affecting patient's functional outcome.   REHAB POTENTIAL: Good  CLINICAL DECISION MAKING: Stable/uncomplicated  EVALUATION COMPLEXITY: Low  GOALS: Goals reviewed with patient? Yes  SHORT TERM GOALS: Target date: 02/04/2023  Patient will be independent and compliant with initial HEP.   Baseline: issued at eval  Goal status: INITIAL  2.  Patient will demonstrate at least 80 degrees of Rt shoulder ER/IR AROM to improve ability to complete self-care activities.  Baseline: see above Goal status: INITIAL  3.  Patient will demonstrate at least 120 degrees of Rt shoulder flexion/abduction AROM without shoulder shrug to improve ability to complete reaching activity.  Baseline: see above Goal status: INITIAL  LONG TERM GOALS: Target date: 03/06/23  Patient will score at least 71% on FOTO to signify clinically meaningful improvement in functional abilities.   Baseline: see above Goal status: INITIAL  2.  Patient will demonstrate at least 4+/5 Rt shoulder strength to improve ability to lift and carry items.  Baseline: see above Goal status: INITIAL  3.  Patient will report pain at worst rated as </=2/10 to improve sleep quality.  Baseline: 5 Goal status: INITIAL  4.  Patient will be independent with advanced home program to progress/maintain current level of function.  Baseline: initial HEP issued  Goal status: INITIAL  PLAN: PT FREQUENCY: 1x/week  PT DURATION: 8 weeks  PLANNED INTERVENTIONS: 97164- PT  Re-evaluation, 97110-Therapeutic exercises, 97530- Therapeutic activity, [id number]- Neuromuscular re-education, 97535- Self Care, 64403- Manual therapy, [id number]- Aquatic Therapy, 97014- Electrical stimulation (unattended), [id number]- Electrical stimulation (manual), 97016- Vasopneumatic device, [id number]- Ionotophoresis 4mg /ml Dexamethasone, Dry Needling, Joint mobilization, Cryotherapy, and Moist heat  PLAN FOR NEXT SESSION: review and progress HEP prn; shoulder AAROM to AROM as tolerated; postural strengthening (working on avoid upper trap engagement), progress rotator cuff strengthening as tolerated (consider reactive isometrics); assess GH joint mobility and treat as needed.   Edmonia Caprio, PT, PhD, DPT  01/14/23 12:54 PM

## 2023-01-26 ENCOUNTER — Ambulatory Visit: Payer: BC Managed Care – PPO

## 2023-01-26 ENCOUNTER — Other Ambulatory Visit: Payer: Self-pay | Admitting: Physician Assistant

## 2023-01-26 DIAGNOSIS — S46011D Strain of muscle(s) and tendon(s) of the rotator cuff of right shoulder, subsequent encounter: Secondary | ICD-10-CM | POA: Diagnosis not present

## 2023-01-26 DIAGNOSIS — M25511 Pain in right shoulder: Secondary | ICD-10-CM | POA: Diagnosis not present

## 2023-01-26 DIAGNOSIS — X58XXXD Exposure to other specified factors, subsequent encounter: Secondary | ICD-10-CM | POA: Diagnosis not present

## 2023-01-26 DIAGNOSIS — M6281 Muscle weakness (generalized): Secondary | ICD-10-CM

## 2023-01-26 DIAGNOSIS — M25611 Stiffness of right shoulder, not elsewhere classified: Secondary | ICD-10-CM | POA: Diagnosis not present

## 2023-01-26 NOTE — Therapy (Signed)
 OUTPATIENT PHYSICAL THERAPY UPPER EXTREMITY TREATMENT NOTE   Patient Name: Kelly Wheeler MRN: [id number] DOB:Mar 27, 1960, 62 y.o., female Today's Date: 01/26/2023  END OF SESSION:  PT End of Session - 01/26/23 1441     Visit Number 3    Number of Visits 9    Date for PT Re-Evaluation 03/06/23    Authorization Type [employer]    PT Start Time 1443    PT Stop Time 1526    PT Time Calculation (min) 43 min    Activity Tolerance Patient tolerated treatment well    Behavior During Therapy [facility] for tasks assessed/performed               History reviewed. No pertinent past medical history. Past Surgical History:  Procedure Laterality Date   cyst removed under Right breast  1-11   TUBAL LIGATION     Patient Active Problem List   Diagnosis Date Noted   Traumatic tear of right rotator cuff 12/02/2022   Pre-diabetes 11/03/2022   Trouble in sleeping 05/23/2021   Elevated LDL cholesterol level 05/21/2021   Elevated blood pressure reading 05/20/2021   History of abnormal cervical Papanicolaou smear 05/19/2021   Vitamin D  deficiency 11/07/2018   Sinus drainage 04/15/2018   Seasonal allergies 04/15/2018   Class 1 obesity due to excess calories without serious comorbidity with body mass index (BMI) of 33.0 to 33.9 in adult 09/25/2017   Hot flashes 01/02/2017   B12 deficiency 01/02/2017   Overweight (BMI 25.0-29.9) 01/01/2017   Anterior cruciate ligament complete tear, left, initial encounter 12/17/2015   Elevated fasting glucose 02/19/2015   Hyperlipidemia 03/28/2014   Elevated liver enzymes 03/28/2014   Menopausal symptoms 09/12/2013   Hypothyroidism 12/09/2009    PCP: Antoniette Vermell CROME, PA-C  REFERRING PROVIDER: Curtis Debby PARAS, MD   REFERRING DIAG: S46.011D (ICD-10-CM) - Traumatic tear of right rotator cuff, unspecified tear extent, subsequent encounter   THERAPY DIAG:  Acute pain of right shoulder  Stiffness of right shoulder, not elsewhere classified  Muscle  weakness (generalized)  Rationale for Evaluation and Treatment: Rehabilitation  ONSET DATE: 11/23/22  SUBJECTIVE:                                                                                                                                                                                      SUBJECTIVE STATEMENT: It's getting better. Not really pain right now. Just uncomfortable.   From eval: Patient reports injuring the Rt shoulder on 11/23/22 when she was playing ball with her grandchild running to a base and reached out to touch it and lost her balance and fell on her Rt shoulder. She reported it was painful,  but she still kept playing ball. The pain worsened and then she went to urgent care 2 days later. She saw Dr. ONEIDA and had an MRI that showed a partial tear in her rotator cuff. She reports overall the pain is improving, but has the most pain first thing in the morning and with reaching. The more she moves it the better it feels. She received exercises from Dr. ONEIDA, but did not feel she was doing them correctly and it was causing her neck to her hurt.   Hand dominance:  writes with Lt hand, but does other activities with the Rt hand  PERTINENT HISTORY: None   PAIN:  Are you having pain? Yes: NPRS scale: none currently; at worst 2/10 Pain location: Rt lateral shoulder Pain description: dull, ache Aggravating factors: reaching out to the side, first waking in AM, sleeping Relieving factors: movement  PRECAUTIONS: None  RED FLAGS: None   WEIGHT BEARING RESTRICTIONS: No  FALLS:  Has patient fallen in last 6 months? Yes. Number of falls 1 fell running to the base while playing ball and lost her balance  LIVING ENVIRONMENT: Lives with: lives with their spouse Lives in: House/apartment Stairs: Yes: External: 2 steps; can reach both Has following equipment at home: None  OCCUPATION: Medical receptionist   PLOF: Independent  PATIENT GOALS: learn exercises to do to help  with pain and mobility   NEXT MD VISIT: 02/16/23  OBJECTIVE:  Note: Objective measures were completed at Evaluation unless otherwise noted.  DIAGNOSTIC FINDINGS:  IMPRESSION: 1. Moderate supraspinatus tendinosis with a small partial-thickness bursal surface tear along the anterior-most aspect and a delamination type partial-thickness articular surface tear with 8.5 mm of retraction of the torn fibers involving the more posterior aspect as well as a small full-thickness component oriented obliquely. 2. Moderate infraspinatus tendinosis. 3. Mild subscapularis tendinosis with a small partial-thickness tear and medial subluxation of the long head of the biceps tendon from the bicipital groove. 4. Moderate-severe arthropathy of the acromioclavicular joint with a inferiorly directed osteophyte. 5. Moderate subacromial/subdeltoid bursitis.  PATIENT SURVEYS :  FOTO 68% function to 71% predicted   COGNITION: Overall cognitive status: Within functional limits for tasks assessed     SENSATION: Not tested  POSTURE: Forward head/rounded shoulders   UPPER EXTREMITY ROM:   Active ROM Right eval Left eval 01/26/23 Right   Shoulder flexion 140 pain; shoulder shrug >90; standing    Shoulder extension     Shoulder abduction 91 pain; standing     Shoulder adduction     Shoulder internal rotation 72 90 80  Shoulder external rotation 67 pain 86 75 pain   Elbow flexion     Elbow extension     Wrist flexion     Wrist extension     Wrist ulnar deviation     Wrist radial deviation     Wrist pronation     Wrist supination     (Blank rows = not tested)  UPPER EXTREMITY MMT:  MMT Right eval Left eval  Shoulder flexion 4- pain; shoulder shrug 4+  Shoulder extension    Shoulder abduction 4 pain; shoulder shrug 5  Shoulder adduction    Shoulder internal rotation 5 5  Shoulder external rotation 4 pain 5  Middle trapezius    Lower trapezius    Elbow flexion    Elbow extension     Wrist flexion    Wrist extension    Wrist ulnar deviation    Wrist radial deviation  Wrist pronation    Wrist supination    Grip strength (lbs)    (Blank rows = not tested)  SHOULDER SPECIAL TESTS: Not tested  JOINT MOBILITY TESTING:  Not tested  PALPATION:  TTP Rt greater tubercle, supraspinatus, upper trap  [facility] Adult PT Treatment:                                                DATE: 01/26/23 Therapeutic Exercise: Supine shoulder flexion AROM x 10  Sidelying shoulder abduction AROM partial range x 10  Sidelying shoulder ER AROM 2 x 10  Standing towel slides flexion x 10; 5 sec hold  Reactive ER/IR isometrics red bad x 10 each  Updated HEP  Manual Therapy: Rt shoulder PROM flexion, abduction, IR, ER to tolerance Rt GHJ mobilizations posterior, anterior, inferior grade II-III   [facility] Adult PT Treatment:                                                DATE: 01/14/2023 Therapeutic Exercise: Supine Dowel external rotation AAROM, x 30 rps Dowel abduction AAROM, x 30 reps Dowel flexion AAROM, x 30 reps Seated Flies (elbows together in front of body at shoulder height) maintaining scapular retraction, x 20 reps Prayer position (hands clasped together at head height) flies (bringing elbows together) maintaining scapular retraction, x 20 reps Manual Therapy: Supine: Soft tissue mobilization to subscapularis, levator scapulae, infraspinatus AP, inferior, PA, distraction glides to R shoulder near end range abduction  [facility] Adult PT Treatment:                                                DATE: 01/07/23 Therapeutic Exercise: Demonstrated, performed and issued initial HEP.   PATIENT EDUCATION: Education details: HEP update  Person educated: Patient Education method: Explanation, Demonstration, Tactile cues, Verbal cues, and Handouts Education comprehension: verbalized understanding, returned demonstration, verbal cues required, tactile cues required, and needs further  education  HOME EXERCISE PROGRAM: Access Code: [email or web address] URL: [email or web address] Date: 01/26/2023 Prepared by: Lucie Meeter  Exercises - Supine Shoulder Flexion Extension Full Range AROM  - 1 x daily - 7 x weekly - 1 sets - 10 reps - 5 sec  hold - Supine Shoulder Abduction AAROM with Dowel  - 1 x daily - 7 x weekly - 1 sets - 10 reps - 5 sec  hold - Supine Shoulder External Rotation in 45 Degrees Abduction AAROM with Dowel  - 1 x daily - 7 x weekly - 1 sets - 10 reps - 5 sec  hold - Sidelying Shoulder Abduction  - 1 x daily - 7 x weekly - 2 sets - 10 reps - Sidelying Shoulder External Rotation  - 1 x daily - 7 x weekly - 2 sets - 10 reps - Standing Shoulder Row with Anchored Resistance  - 1 x daily - 7 x weekly - 2 sets - 10 reps - Standing Isometric Shoulder External Rotation with Doorway  - 1 x daily - 7 x weekly - 2 sets - 10 reps - 5 sec  hold - Shoulder Flexion Wall Slide with  Towel  - 1 x daily - 7 x weekly - 1 sets - 10 reps - 5 sec  hold - Shoulder External Rotation Reactive Isometrics  - 1 x daily - 3 x weekly - 2 sets - 10 reps - Shoulder Internal Rotation Reactive Isometrics  - 1 x daily - 3 x weekly - 2 sets - 10 reps  ASSESSMENT:  CLINICAL IMPRESSION: Patient  continues to report improvement in her Rt shoulder pain. Nearly full passive Rt shoulder flexion, ER, IR ROM achieved, but abduction remains limited due to pain. ER and IR AROM have improved compared to initial evaluation, though ER remains painful. Patient able to complete supine shoulder flexion AROM through nearly full range without pain. Able to complete sidelying abduction through partial range with minimal discomfort, but reports pain improves with continued reps. No pain with sidelying ER AROM. Fatigues with reactive isometrics, but no pain.   From eval:  Patient is a 62 y.o. female who was seen today for physical therapy evaluation and treatment for Rt shoulder pain secondary to fall in October  2024. MRI revealed partial tearing with retraction of the supraspinatus, bicipital tendinosis, and tendinosis of other rotator cuff musculature (see above for specifics). Overall her shoulder pain and mobility have improved since onset with patient reporting reaching activity is the most aggravating. She was trying to complete home exercise program issued by referring provider, but feels that she needed more guidance/progression in the PT setting. Upon assessment she is noted to have limited and painful Rt shoulder AROM and weakness/pain with majority of MMT with exception of IR. She will benefit from skilled PT to address Rt shoulder ROM and strength deficits in order to optimize her function and assist in overall pain reduction.   OBJECTIVE IMPAIRMENTS: decreased activity tolerance, decreased mobility, decreased ROM, decreased strength, impaired flexibility, impaired UE functional use, improper body mechanics, postural dysfunction, and pain.   ACTIVITY LIMITATIONS: carrying, lifting, sleeping, dressing, reach over head, and hygiene/grooming  PARTICIPATION LIMITATIONS: meal prep, cleaning, laundry, shopping, and community activity  PERSONAL FACTORS: Age, Fitness, Profession, and Time since onset of injury/illness/exacerbation are also affecting patient's functional outcome.   REHAB POTENTIAL: Good  CLINICAL DECISION MAKING: Stable/uncomplicated  EVALUATION COMPLEXITY: Low  GOALS: Goals reviewed with patient? Yes  SHORT TERM GOALS: Target date: 02/04/2023  Patient will be independent and compliant with initial HEP.   Baseline: issued at eval  Goal status: MET  2.  Patient will demonstrate at least 80 degrees of Rt shoulder ER/IR AROM to improve ability to complete self-care activities.  Baseline: see above Goal status: partially met    3.  Patient will demonstrate at least 120 degrees of Rt shoulder flexion/abduction AROM without shoulder shrug to improve ability to complete reaching  activity.  Baseline: see above Goal status: INITIAL  LONG TERM GOALS: Target date: 03/06/23  Patient will score at least 71% on FOTO to signify clinically meaningful improvement in functional abilities.   Baseline: see above Goal status: INITIAL  2.  Patient will demonstrate at least 4+/5 Rt shoulder strength to improve ability to lift and carry items.  Baseline: see above Goal status: INITIAL  3.  Patient will report pain at worst rated as </=2/10 to improve sleep quality.  Baseline: 5 Goal status: INITIAL  4.  Patient will be independent with advanced home program to progress/maintain current level of function.  Baseline: initial HEP issued  Goal status: INITIAL  PLAN: PT FREQUENCY: 1x/week  PT DURATION: 8 weeks  PLANNED  INTERVENTIONS: 97164- PT Re-evaluation, 97110-Therapeutic exercises, 97530- Therapeutic activity, [id number]- Neuromuscular re-education, 97535- Self Care, [id number]- Manual therapy, [id number]- Aquatic Therapy, 97014- Electrical stimulation (unattended), [id number]- Electrical stimulation (manual), 97016- Vasopneumatic device, [id number]- Ionotophoresis 4mg /ml Dexamethasone , Dry Needling, Joint mobilization, Cryotherapy, and Moist heat  PLAN FOR NEXT SESSION: review and progress HEP prn; shoulder AAROM to AROM as tolerated; postural strengthening (working on avoid upper trap engagement), progress rotator cuff strengthening as tolerated   Karel Mowers, PT, DPT, ATC 01/26/23 3:26 PM

## 2023-02-09 ENCOUNTER — Ambulatory Visit: Payer: BC Managed Care – PPO | Attending: Sports Medicine

## 2023-02-09 DIAGNOSIS — M25511 Pain in right shoulder: Secondary | ICD-10-CM | POA: Insufficient documentation

## 2023-02-09 DIAGNOSIS — M25611 Stiffness of right shoulder, not elsewhere classified: Secondary | ICD-10-CM | POA: Diagnosis present

## 2023-02-09 DIAGNOSIS — M6281 Muscle weakness (generalized): Secondary | ICD-10-CM | POA: Insufficient documentation

## 2023-02-09 NOTE — Therapy (Signed)
 OUTPATIENT PHYSICAL THERAPY UPPER EXTREMITY TREATMENT NOTE   Patient Name: Kelly Wheeler MRN: [id number] DOB:May 05, 1960, 63 y.o., female Today's Date: 02/09/2023  END OF SESSION:  PT End of Session - 02/09/23 0801     Visit Number 4    Number of Visits 9    Date for PT Re-Evaluation 03/06/23    Authorization Type [employer]    PT Start Time 0801    PT Stop Time 0844    PT Time Calculation (min) 43 min    Activity Tolerance Patient tolerated treatment well    Behavior During Therapy [facility] for tasks assessed/performed                History reviewed. No pertinent past medical history. Past Surgical History:  Procedure Laterality Date   cyst removed under Right breast  1-11   TUBAL LIGATION     Patient Active Problem List   Diagnosis Date Noted   Traumatic tear of right rotator cuff 12/02/2022   Pre-diabetes 11/03/2022   Trouble in sleeping 05/23/2021   Elevated LDL cholesterol level 05/21/2021   Elevated blood pressure reading 05/20/2021   History of abnormal cervical Papanicolaou smear 05/19/2021   Vitamin D  deficiency 11/07/2018   Sinus drainage 04/15/2018   Seasonal allergies 04/15/2018   Class 1 obesity due to excess calories without serious comorbidity with body mass index (BMI) of 33.0 to 33.9 in adult 09/25/2017   Hot flashes 01/02/2017   B12 deficiency 01/02/2017   Overweight (BMI 25.0-29.9) 01/01/2017   Anterior cruciate ligament complete tear, left, initial encounter 12/17/2015   Elevated fasting glucose 02/19/2015   Hyperlipidemia 03/28/2014   Elevated liver enzymes 03/28/2014   Menopausal symptoms 09/12/2013   Hypothyroidism 12/09/2009    PCP: Antoniette Vermell CROME, PA-C  REFERRING PROVIDER: Curtis Debby PARAS, MD   REFERRING DIAG: S46.011D (ICD-10-CM) - Traumatic tear of right rotator cuff, unspecified tear extent, subsequent encounter   THERAPY DIAG:  Acute pain of right shoulder  Stiffness of right shoulder, not elsewhere classified  Muscle  weakness (generalized)  Rationale for Evaluation and Treatment: Rehabilitation  ONSET DATE: 11/23/22  SUBJECTIVE:                                                                                                                                                                                      SUBJECTIVE STATEMENT: Patient reports the shoulder is feeling better. Still can't reach behind her back (functional IR) due to pain and tightness.  From eval: Patient reports injuring the Rt shoulder on 11/23/22 when she was playing ball with her grandchild running to a base and reached out to touch it and lost her balance and  fell on her Rt shoulder. She reported it was painful, but she still kept playing ball. The pain worsened and then she went to urgent care 2 days later. She saw Dr. ONEIDA and had an MRI that showed a partial tear in her rotator cuff. She reports overall the pain is improving, but has the most pain first thing in the morning and with reaching. The more she moves it the better it feels. She received exercises from Dr. ONEIDA, but did not feel she was doing them correctly and it was causing her neck to her hurt.   Hand dominance:  writes with Lt hand, but does other activities with the Rt hand  PERTINENT HISTORY: None   PAIN:  Are you having pain? Yes: NPRS scale: none currently; at worst 2/10 Pain location: Rt lateral shoulder Pain description: dull, ache Aggravating factors: reaching out to the side, first waking in AM, sleeping Relieving factors: movement  PRECAUTIONS: None  RED FLAGS: None   WEIGHT BEARING RESTRICTIONS: No  FALLS:  Has patient fallen in last 6 months? Yes. Number of falls 1 fell running to the base while playing ball and lost her balance  LIVING ENVIRONMENT: Lives with: lives with their spouse Lives in: House/apartment Stairs: Yes: External: 2 steps; can reach both Has following equipment at home: None  OCCUPATION: Medical receptionist   PLOF:  Independent  PATIENT GOALS: learn exercises to do to help with pain and mobility   NEXT MD VISIT: 02/16/23  OBJECTIVE:  Note: Objective measures were completed at Evaluation unless otherwise noted.  DIAGNOSTIC FINDINGS:  IMPRESSION: 1. Moderate supraspinatus tendinosis with a small partial-thickness bursal surface tear along the anterior-most aspect and a delamination type partial-thickness articular surface tear with 8.5 mm of retraction of the torn fibers involving the more posterior aspect as well as a small full-thickness component oriented obliquely. 2. Moderate infraspinatus tendinosis. 3. Mild subscapularis tendinosis with a small partial-thickness tear and medial subluxation of the long head of the biceps tendon from the bicipital groove. 4. Moderate-severe arthropathy of the acromioclavicular joint with a inferiorly directed osteophyte. 5. Moderate subacromial/subdeltoid bursitis.  PATIENT SURVEYS :  FOTO 68% function to 71% predicted   COGNITION: Overall cognitive status: Within functional limits for tasks assessed     SENSATION: Not tested  POSTURE: Forward head/rounded shoulders   UPPER EXTREMITY ROM:   Active ROM Right eval Left eval 01/26/23 Right  02/09/23 Right   Shoulder flexion 140 pain; shoulder shrug >90; standing   165 pain; standing   Shoulder extension      Shoulder abduction 91 pain; standing    150 pain; standing   Shoulder adduction      Shoulder internal rotation 72 90 80 85 pain   Shoulder external rotation 67 pain 86 75 pain  75  Elbow flexion      Elbow extension      Wrist flexion      Wrist extension      Wrist ulnar deviation      Wrist radial deviation      Wrist pronation      Wrist supination      (Blank rows = not tested)  UPPER EXTREMITY MMT:  MMT Right eval Left eval  Shoulder flexion 4- pain; shoulder shrug 4+  Shoulder extension    Shoulder abduction 4 pain; shoulder shrug 5  Shoulder adduction    Shoulder  internal rotation 5 5  Shoulder external rotation 4 pain 5  Middle trapezius    Lower  trapezius    Elbow flexion    Elbow extension    Wrist flexion    Wrist extension    Wrist ulnar deviation    Wrist radial deviation    Wrist pronation    Wrist supination    Grip strength (lbs)    (Blank rows = not tested)  SHOULDER SPECIAL TESTS: Not tested  JOINT MOBILITY TESTING:  Not tested  PALPATION:  TTP Rt greater tubercle, supraspinatus, upper trap   [facility] Adult PT Treatment:                                                DATE: 02/09/23 Therapeutic Exercise: Standing ER AAROM with dowel x 10  Standing shoulder abduction/flexion with dowel attempted Standing shoulder abduction towel slides x 10  Resisted shoulder ER/IR red band x 10 each  Updated HEP  Manual Therapy: Rt shoulder PROM flexion, abduction, IR, ER to tolerance Rt GHJ mobilizations posterior, anterior, inferior grade II-III   [facility] Adult PT Treatment:                                                DATE: 01/26/23 Therapeutic Exercise: Supine shoulder flexion AROM x 10  Sidelying shoulder abduction AROM partial range x 10  Sidelying shoulder ER AROM 2 x 10  Standing towel slides flexion x 10; 5 sec hold  Reactive ER/IR isometrics red bad x 10 each  Updated HEP  Manual Therapy: Rt shoulder PROM flexion, abduction, IR, ER to tolerance Rt GHJ mobilizations posterior, anterior, inferior grade II-III   [facility] Adult PT Treatment:                                                DATE: 01/14/2023 Therapeutic Exercise: Supine Dowel external rotation AAROM, x 30 rps Dowel abduction AAROM, x 30 reps Dowel flexion AAROM, x 30 reps Seated Flies (elbows together in front of body at shoulder height) maintaining scapular retraction, x 20 reps Prayer position (hands clasped together at head height) flies (bringing elbows together) maintaining scapular retraction, x 20 reps Manual Therapy: Supine: Soft tissue mobilization to  subscapularis, levator scapulae, infraspinatus AP, inferior, PA, distraction glides to R shoulder near end range abduction    PATIENT EDUCATION: Education details: HEP update  Person educated: Patient Education method: Explanation, Demonstration, Tactile cues, Verbal cues, and Handouts Education comprehension: verbalized understanding, returned demonstration, verbal cues required, tactile cues required, and needs further education  HOME EXERCISE PROGRAM: Access Code: [email or web address] URL: [email or web address] Date: 02/09/2023 Prepared by: Lucie Meeter  Exercises - Standing Shoulder Row with Anchored Resistance  - 1 x daily - 7 x weekly - 2 sets - 10 reps - Shoulder Flexion Wall Slide with Towel  - 1 x daily - 7 x weekly - 1 sets - 10 reps - 5 sec  hold - Standing Shoulder Abduction Slides at Wall  - 1 x daily - 7 x weekly - 1 sets - 10 reps - 5 sec  hold - Standing Shoulder External Rotation AAROM with Dowel  - 1 x daily - 7 x weekly - 3 sets -  10 reps - Shoulder External Rotation with Anchored Resistance  - 1 x daily - 3 x weekly - 2 sets - 10 reps - Shoulder Internal Rotation with Resistance  - 1 x daily - 3 x weekly - 2 sets - 10 reps - Sidelying Shoulder External Rotation  - 1 x daily - 3 x weekly - 2 sets - 10 reps - Sidelying Shoulder Abduction  - 1 x daily - 3 x weekly - 2 sets - 10 reps - Supine Shoulder Flexion Extension Full Range AROM  - 1 x daily - 3 x weekly - 2 sets - 10 reps  ASSESSMENT:  CLINICAL IMPRESSION: Sudiksha is making steady progress in PT with noted improvement in Rt shoulder AROM in all planes compared to initial evaluation, though continues to have pain at available end range of flexion, abduction, and IR. Focused on progression of ROM and strengthening with good tolerance. Does have difficulty coordinating movement with standing AAROM with use of dowel with inability to complete independently with flexion and abduction. Able to complete standing ER  AAROM with the dowel independently after initial cueing. Progressed rotator cuff strengthening requiring intermittent cues to reduce shoulder shrug.   From eval:  Patient is a 63 y.o. female who was seen today for physical therapy evaluation and treatment for Rt shoulder pain secondary to fall in October 2024. MRI revealed partial tearing with retraction of the supraspinatus, bicipital tendinosis, and tendinosis of other rotator cuff musculature (see above for specifics). Overall her shoulder pain and mobility have improved since onset with patient reporting reaching activity is the most aggravating. She was trying to complete home exercise program issued by referring provider, but feels that she needed more guidance/progression in the PT setting. Upon assessment she is noted to have limited and painful Rt shoulder AROM and weakness/pain with majority of MMT with exception of IR. She will benefit from skilled PT to address Rt shoulder ROM and strength deficits in order to optimize her function and assist in overall pain reduction.   OBJECTIVE IMPAIRMENTS: decreased activity tolerance, decreased mobility, decreased ROM, decreased strength, impaired flexibility, impaired UE functional use, improper body mechanics, postural dysfunction, and pain.   ACTIVITY LIMITATIONS: carrying, lifting, sleeping, dressing, reach over head, and hygiene/grooming  PARTICIPATION LIMITATIONS: meal prep, cleaning, laundry, shopping, and community activity  PERSONAL FACTORS: Age, Fitness, Profession, and Time since onset of injury/illness/exacerbation are also affecting patient's functional outcome.   REHAB POTENTIAL: Good  CLINICAL DECISION MAKING: Stable/uncomplicated  EVALUATION COMPLEXITY: Low  GOALS: Goals reviewed with patient? Yes  SHORT TERM GOALS: Target date: 02/04/2023  Patient will be independent and compliant with initial HEP.   Baseline: issued at eval  Goal status: MET  2.  Patient will demonstrate  at least 80 degrees of Rt shoulder ER/IR AROM to improve ability to complete self-care activities.  Baseline: see above Goal status: partially met    3.  Patient will demonstrate at least 120 degrees of Rt shoulder flexion/abduction AROM without shoulder shrug to improve ability to complete reaching activity.  Baseline: see above Goal status: MET  LONG TERM GOALS: Target date: 03/06/23  Patient will score at least 71% on FOTO to signify clinically meaningful improvement in functional abilities.   Baseline: see above Goal status: INITIAL  2.  Patient will demonstrate at least 4+/5 Rt shoulder strength to improve ability to lift and carry items.  Baseline: see above Goal status: INITIAL  3.  Patient will report pain at worst rated as </=2/10 to  improve sleep quality.  Baseline: 5 Goal status: INITIAL  4.  Patient will be independent with advanced home program to progress/maintain current level of function.  Baseline: initial HEP issued  Goal status: INITIAL  PLAN: PT FREQUENCY: 1x/week  PT DURATION: 8 weeks  PLANNED INTERVENTIONS: 97164- PT Re-evaluation, 97110-Therapeutic exercises, 97530- Therapeutic activity, [id number]- Neuromuscular re-education, 97535- Self Care, [id number]- Manual therapy, [id number]- Aquatic Therapy, 97014- Electrical stimulation (unattended), [id number]- Electrical stimulation (manual), 97016- Vasopneumatic device, [id number]- Ionotophoresis 4mg /ml Dexamethasone , Dry Needling, Joint mobilization, Cryotherapy, and Moist heat  PLAN FOR NEXT SESSION: review and progress HEP prn; shoulder AAROM to AROM as tolerated; postural strengthening (working on avoid upper trap engagement), progress rotator cuff strengthening as tolerated   Prathik Aman, PT, DPT, ATC 02/09/23 8:44 AM

## 2023-02-11 ENCOUNTER — Ambulatory Visit: Payer: BC Managed Care – PPO | Admitting: Sports Medicine

## 2023-02-16 ENCOUNTER — Ambulatory Visit: Payer: BC Managed Care – PPO | Admitting: Sports Medicine

## 2023-02-23 ENCOUNTER — Ambulatory Visit: Payer: BC Managed Care – PPO

## 2023-03-01 ENCOUNTER — Encounter: Payer: BC Managed Care – PPO | Admitting: Physical Therapy

## 2023-03-08 ENCOUNTER — Ambulatory Visit: Payer: BC Managed Care – PPO | Attending: Sports Medicine

## 2023-03-08 ENCOUNTER — Ambulatory Visit: Payer: BC Managed Care – PPO | Admitting: Sports Medicine

## 2023-03-08 DIAGNOSIS — M6281 Muscle weakness (generalized): Secondary | ICD-10-CM | POA: Insufficient documentation

## 2023-03-08 DIAGNOSIS — M25611 Stiffness of right shoulder, not elsewhere classified: Secondary | ICD-10-CM | POA: Diagnosis present

## 2023-03-08 DIAGNOSIS — M25511 Pain in right shoulder: Secondary | ICD-10-CM | POA: Insufficient documentation

## 2023-03-08 NOTE — Therapy (Signed)
 OUTPATIENT PHYSICAL THERAPY UPPER EXTREMITY TREATMENT NOTE RE-CERTIFICATION PHYSICAL THERAPY DISCHARGE SUMMARY  Visits from Start of Care: 5  Current functional level related to goals / functional outcomes: All goals met   Remaining deficits: Pain with functional IR reach Mild Rt shoulder weakness   Education / Equipment: See education below    Patient agrees to discharge. Patient goals were met. Patient is being discharged due to meeting the stated rehab goals.   Patient Name: Kelly Wheeler MRN: [id number] DOB:17-Jul-1960, 63 y.o., female Today's Date: 03/08/2023  END OF SESSION:  PT End of Session - 03/08/23 0847     Visit Number 5    Number of Visits 9    Date for PT Re-Evaluation --   N/A D/C   Authorization Type [employer]    PT Start Time [phone]    PT Stop Time 0926    PT Time Calculation (min) 39 min    Activity Tolerance Patient tolerated treatment well    Behavior During Therapy [facility] [place] for tasks assessed/performed                 History reviewed. No pertinent past medical history. Past Surgical History:  Procedure Laterality Date   cyst removed under Right breast  1-11   TUBAL LIGATION     Patient Active Problem List   Diagnosis Date Noted   Traumatic tear of right rotator cuff 12/02/2022   Pre-diabetes 11/03/2022   Trouble in sleeping 05/23/2021   Elevated LDL cholesterol level 05/21/2021   Elevated blood pressure reading 05/20/2021   History of abnormal cervical Papanicolaou smear 05/19/2021   Vitamin D  deficiency 11/07/2018   Sinus drainage 04/15/2018   Seasonal allergies 04/15/2018   Class 1 obesity due to excess calories without serious comorbidity with body mass index (BMI) of 33.0 to 33.9 in adult 09/25/2017   Hot flashes 01/02/2017   B12 deficiency 01/02/2017   Overweight (BMI 25.0-29.9) 01/01/2017   Anterior cruciate ligament complete tear, left, initial encounter 12/17/2015   Elevated fasting glucose 02/19/2015   Hyperlipidemia 03/28/2014    Elevated liver enzymes 03/28/2014   Menopausal symptoms 09/12/2013   Hypothyroidism 12/09/2009    PCP: Araceli Knight, PA-C  REFERRING PROVIDER: Gean Keels, MD   REFERRING DIAG: S46.011D (ICD-10-CM) - Traumatic tear of right rotator cuff, unspecified tear extent, subsequent encounter   THERAPY DIAG:  Acute pain of right shoulder  Stiffness of right shoulder, not elsewhere classified  Muscle weakness (generalized)  Rationale for Evaluation and Treatment: Rehabilitation  ONSET DATE: 11/23/22  SUBJECTIVE:  SUBJECTIVE STATEMENT: Patient feels that the shoulder continues to feel better. She feels that if she continues with her exercises 3 times a week she will be ok and continue to see improvement. She has f/u with Dr. Elva Hamburger in March.   From eval: Patient reports injuring the Rt shoulder on 11/23/22 when she was playing ball with her grandchild running to a base and reached out to touch it and lost her balance and fell on her Rt shoulder. She reported it was painful, but she still kept playing ball. The pain worsened and then she went to urgent care 2 days later. She saw Dr. Elva Hamburger and had an MRI that showed a partial tear in her rotator cuff. She reports overall the pain is improving, but has the most pain first thing in the morning and with reaching. The more she moves it the better it feels. She received exercises from Dr. Elva Hamburger, but did not feel she was doing them correctly and it was causing her neck to her hurt.   Hand dominance:  writes with Lt hand, but does other activities with the Rt hand  PERTINENT HISTORY: None   PAIN:  Are you having pain? Yes: NPRS scale: none currently; at worst 1-2/10 Pain location: Rt lateral shoulder Pain description: dull, ache Aggravating factors: reaching behind  the back Relieving factors: movement  PRECAUTIONS: None  RED FLAGS: None   WEIGHT BEARING RESTRICTIONS: No  FALLS:  Has patient fallen in last 6 months? Yes. Number of falls 1 fell running to the base while playing ball and lost her balance  LIVING ENVIRONMENT: Lives with: lives with their spouse Lives in: House/apartment Stairs: Yes: External: 2 steps; can reach both Has following equipment at home: None  OCCUPATION: Medical receptionist   PLOF: Independent  PATIENT GOALS: learn exercises to do to help with pain and mobility   NEXT MD VISIT: 02/16/23  OBJECTIVE:  Note: Objective measures were completed at Evaluation unless otherwise noted.  DIAGNOSTIC FINDINGS:  IMPRESSION: 1. Moderate supraspinatus tendinosis with a small partial-thickness bursal surface tear along the anterior-most aspect and a delamination type partial-thickness articular surface tear with 8.5 mm of retraction of the torn fibers involving the more posterior aspect as well as a small full-thickness component oriented obliquely. 2. Moderate infraspinatus tendinosis. 3. Mild subscapularis tendinosis with a small partial-thickness tear and medial subluxation of the long head of the biceps tendon from the bicipital groove. 4. Moderate-severe arthropathy of the acromioclavicular joint with a inferiorly directed osteophyte. 5. Moderate subacromial/subdeltoid bursitis.  PATIENT SURVEYS :  FOTO 68% function to 71% predicted  03/08/23: 83% function  COGNITION: Overall cognitive status: Within functional limits for tasks assessed     SENSATION: Not tested  POSTURE: Forward head/rounded shoulders   UPPER EXTREMITY ROM:   Active ROM Right eval Left eval 01/26/23 Right  02/09/23 Right  03/08/23 Right   Shoulder flexion 140 pain; shoulder shrug >90; standing   165 pain; standing  171 standing  Shoulder extension       Shoulder abduction 91 pain; standing    150 pain; standing  165 standing    Shoulder adduction       Shoulder internal rotation 72 90 80 85 pain  86  Shoulder external rotation 67 pain 86 75 pain  75 83  Elbow flexion       Elbow extension       Wrist flexion       Wrist extension       Wrist ulnar  deviation       Wrist radial deviation       Wrist pronation       Wrist supination       (Blank rows = not tested)  UPPER EXTREMITY MMT:  MMT Right eval Left eval 03/08/23 Right   Shoulder flexion 4- pain; shoulder shrug 4+ 4+   Shoulder extension     Shoulder abduction 4 pain; shoulder shrug 5 4+ pain  Shoulder adduction     Shoulder internal rotation 5 5 5   Shoulder external rotation 4 pain 5 5  Middle trapezius     Lower trapezius     Elbow flexion     Elbow extension     Wrist flexion     Wrist extension     Wrist ulnar deviation     Wrist radial deviation     Wrist pronation     Wrist supination     Grip strength (lbs)     (Blank rows = not tested)  SHOULDER SPECIAL TESTS: Not tested  JOINT MOBILITY TESTING:  Not tested  PALPATION:  TTP Rt greater tubercle, supraspinatus, upper trap  [facility] Adult PT Treatment:                                                DATE: 03/08/23 Therapeutic Exercise: Sideyling shoulder abduction 1 lb x 10  Sidelying shoulder ER 1 lb x 10  Supine resisted shoulder flexion yellow band x 10  Supine serratus punch x 10 Rows x 15 blue band Reviewed, performed, and issued advanced HEP discussing frequency, sets, reps, and ways to progress independently.    Therapeutic Activity: Re-assessment to determine overall progress, educating patient on progress towards goals.    [facility] Adult PT Treatment:                                                DATE: 02/09/23 Therapeutic Exercise: Standing ER AAROM with dowel x 10  Standing shoulder abduction/flexion with dowel attempted Standing shoulder abduction towel slides x 10  Resisted shoulder ER/IR red band x 10 each  Updated HEP  Manual Therapy: Rt shoulder PROM  flexion, abduction, IR, ER to tolerance Rt GHJ mobilizations posterior, anterior, inferior grade II-III   [facility] Adult PT Treatment:                                                DATE: 01/26/23 Therapeutic Exercise: Supine shoulder flexion AROM x 10  Sidelying shoulder abduction AROM partial range x 10  Sidelying shoulder ER AROM 2 x 10  Standing towel slides flexion x 10; 5 sec hold  Reactive ER/IR isometrics red bad x 10 each  Updated HEP  Manual Therapy: Rt shoulder PROM flexion, abduction, IR, ER to tolerance Rt GHJ mobilizations posterior, anterior, inferior grade II-III   [facility] Adult PT Treatment:  DATE: 01/14/2023 Therapeutic Exercise: Supine Dowel external rotation AAROM, x 30 rps Dowel abduction AAROM, x 30 reps Dowel flexion AAROM, x 30 reps Seated Flies (elbows together in front of body at shoulder height) maintaining scapular retraction, x 20 reps Prayer position (hands clasped together at head height) flies (bringing elbows together) maintaining scapular retraction, x 20 reps Manual Therapy: Supine: Soft tissue mobilization to subscapularis, levator scapulae, infraspinatus AP, inferior, PA, distraction glides to R shoulder near end range abduction    PATIENT EDUCATION: Education details: HEP update; d/c education  Person educated: Patient Education method: Explanation, Demonstration, Tactile cues, Verbal cues, and Handouts Education comprehension: verbalized understanding and returned demonstration  HOME EXERCISE PROGRAM: Access Code: [email or web address] URL: [email or web address] Date: 03/08/2023 Prepared by: Forrestine Ike  Exercises - Shoulder Flexion Wall Slide with Towel  - 1 x daily - 7 x weekly - 1 sets - 10 reps - 5 sec  hold - Standing Shoulder Abduction Slides at Wall  - 1 x daily - 7 x weekly - 1 sets - 10 reps - 5 sec  hold - Standing Shoulder Row with Anchored Resistance  - 1 x daily - 3 x weekly -  2 sets - 10 reps - Shoulder External Rotation with Anchored Resistance  - 1 x daily - 3 x weekly - 2 sets - 10 reps - Shoulder Internal Rotation with Resistance  - 1 x daily - 3 x weekly - 2 sets - 10 reps - Sidelying Shoulder External Rotation  - 1 x daily - 3 x weekly - 2 sets - 10 reps - Sidelying Shoulder Abduction  - 1 x daily - 3 x weekly - 2 sets - 10 reps - Supine Single Arm Shoulder Flexion with Resistance  - 1 x daily - 3 x weekly - 2 sets - 10 reps - Supine Scapular Protraction in Flexion with Dumbbells  - 1 x daily - 3 x weekly - 2 sets - 10 reps  ASSESSMENT:  CLINICAL IMPRESSION: Fransheska has progressed well in PT for her chronic Rt shoulder pain. She reports overall improvement in her shoulder pain reporting only aggravating factor is reaching behind her back at this time. She demonstrates functional and pain free Rt shoulder AROM and overall improvements in Rt shoulder strength. She has met all established functional goals and demonstrates independence with advanced HEP that was issued today. She is therefore appropriate for discharge at this time with patient in agreement with this plan.   From eval:  Patient is a 63 y.o. female who was seen today for physical therapy evaluation and treatment for Rt shoulder pain secondary to fall in October 2024. MRI revealed partial tearing with retraction of the supraspinatus, bicipital tendinosis, and tendinosis of other rotator cuff musculature (see above for specifics). Overall her shoulder pain and mobility have improved since onset with patient reporting reaching activity is the most aggravating. She was trying to complete home exercise program issued by referring provider, but feels that she needed more guidance/progression in the PT setting. Upon assessment she is noted to have limited and painful Rt shoulder AROM and weakness/pain with majority of MMT with exception of IR. She will benefit from skilled PT to address Rt shoulder ROM and strength  deficits in order to optimize her function and assist in overall pain reduction.   OBJECTIVE IMPAIRMENTS: decreased activity tolerance, decreased mobility, decreased ROM, decreased strength, impaired flexibility, impaired UE functional use, improper body mechanics, postural dysfunction, and pain.   ACTIVITY LIMITATIONS: carrying, lifting, sleeping,  dressing, reach over head, and hygiene/grooming  PARTICIPATION LIMITATIONS: meal prep, cleaning, laundry, shopping, and community activity  PERSONAL FACTORS: Age, Fitness, Profession, and Time since onset of injury/illness/exacerbation are also affecting patient's functional outcome.   REHAB POTENTIAL: Good  CLINICAL DECISION MAKING: Stable/uncomplicated  EVALUATION COMPLEXITY: Low  GOALS: Goals reviewed with patient? Yes  SHORT TERM GOALS: Target date: 02/04/2023  Patient will be independent and compliant with initial HEP.   Baseline: issued at eval  Goal status: MET  2.  Patient will demonstrate at least 80 degrees of Rt shoulder ER/IR AROM to improve ability to complete self-care activities.  Baseline: see above Goal status: MET  3.  Patient will demonstrate at least 120 degrees of Rt shoulder flexion/abduction AROM without shoulder shrug to improve ability to complete reaching activity.  Baseline: see above Goal status: MET  LONG TERM GOALS: Target date: 03/06/23  Patient will score at least 71% on FOTO to signify clinically meaningful improvement in functional abilities.   Baseline: see above Goal status: MET  2.  Patient will demonstrate at least 4+/5 Rt shoulder strength to improve ability to lift and carry items.  Baseline: see above Goal status: MET  3.  Patient will report pain at worst rated as </=2/10 to improve sleep quality.  Baseline: 5 Goal status: MET  4.  Patient will be independent with advanced home program to progress/maintain current level of function.  Baseline: initial HEP issued  Goal status:  MET  PLAN: PT FREQUENCY: 1x/week  PT DURATION: 8 weeks  PLANNED INTERVENTIONS: 97164- PT Re-evaluation, 97110-Therapeutic exercises, 97530- Therapeutic activity, [id number]- Neuromuscular re-education, 97535- Self Care, 78295- Manual therapy, [id number]- Aquatic Therapy, 97014- Electrical stimulation (unattended), [id number]- Electrical stimulation (manual), 97016- Vasopneumatic device, [id number]- Ionotophoresis 4mg /ml Dexamethasone , Dry Needling, Joint mobilization, Cryotherapy, and Moist heat  PLAN FOR NEXT SESSION: N/A D/C   Graciana Sessa, PT, DPT, ATC 03/08/23 12:45 PM

## 2023-03-29 ENCOUNTER — Encounter: Payer: Self-pay | Admitting: Sports Medicine

## 2023-03-29 ENCOUNTER — Ambulatory Visit: Payer: BC Managed Care – PPO | Admitting: Sports Medicine

## 2023-03-29 DIAGNOSIS — S46011D Strain of muscle(s) and tendon(s) of the rotator cuff of right shoulder, subsequent encounter: Secondary | ICD-10-CM

## 2023-03-29 NOTE — Assessment & Plan Note (Signed)
 This exquisitely pleasant 63 year old female returns, she was playing baseball with her friends and fell directly onto her shoulder back in October, she had immediate pain and inability to AB duct, x-rays were obtained in urgent care, read as negative. Ultimately we obtained an MRI that showed significant rotator cuff partial tearing, particular supraspinatus with some retraction, there was also severe acromioclavicular arthritis with joint hypertrophy and an inferiorly directed spur. Overall she has done really well with physical therapy both formal therapy and at home. She is happy with how things are going, she has good motion, good strength. We discussed avoidance of repetitive motion, jerking motions and overhead activity, she can return to see me as needed.

## 2023-03-29 NOTE — Progress Notes (Signed)
    Procedures performed today:    None.  Independent interpretation of notes and tests performed by another provider:   None.  Brief History, Exam, Impression, and Recommendations:    Traumatic tear of right rotator cuff This exquisitely pleasant 63 year old female returns, she was playing baseball with her friends and fell directly onto her shoulder back in October, she had immediate pain and inability to AB duct, x-rays were obtained in urgent care, read as negative. Ultimately we obtained an MRI that showed significant rotator cuff partial tearing, particular supraspinatus with some retraction, there was also severe acromioclavicular arthritis with joint hypertrophy and an inferiorly directed spur. Overall she has done really well with physical therapy both formal therapy and at home. She is happy with how things are going, she has good motion, good strength. We discussed avoidance of repetitive motion, jerking motions and overhead activity, she can return to see me as needed.    ____________________________________________ Ihor Austin. Benjamin Stain, M.D., ABFM., CAQSM., AME. Primary Care and Sports Medicine [facility]  Adjunct Professor of Family Medicine  [place] [employer]  Restaurant manager, fast food

## 2023-05-05 ENCOUNTER — Other Ambulatory Visit: Payer: Self-pay | Admitting: Physician Assistant

## 2023-05-22 ENCOUNTER — Other Ambulatory Visit: Payer: Self-pay

## 2023-05-22 ENCOUNTER — Ambulatory Visit
Admission: EM | Admit: 2023-05-22 | Discharge: 2023-05-22 | Disposition: A | Discharge status: Home / Self Care | Attending: Family Medicine | Admitting: Family Medicine

## 2023-05-22 DIAGNOSIS — J0100 Acute maxillary sinusitis, unspecified: Secondary | ICD-10-CM | POA: Diagnosis not present

## 2023-05-22 DIAGNOSIS — R0981 Nasal congestion: Secondary | ICD-10-CM | POA: Diagnosis not present

## 2023-05-22 DIAGNOSIS — J309 Allergic rhinitis, unspecified: Secondary | ICD-10-CM | POA: Diagnosis not present

## 2023-05-22 HISTORY — DX: Hypothyroidism, unspecified: E03.9

## 2023-05-22 MED ORDER — DOXYCYCLINE HYCLATE 100 MG PO CAPS: 100.0000 mg | ORAL_CAPSULE | Freq: Two times a day (BID) | ORAL | 0 refills | Status: AC

## 2023-05-22 MED ORDER — FEXOFENADINE HCL 180 MG PO TABS: 180.0000 mg | ORAL_TABLET | Freq: Every day | ORAL | 0 refills | Status: DC

## 2023-05-22 MED ORDER — PREDNISONE 20 MG PO TABS: ORAL_TABLET | ORAL | 0 refills | Status: DC

## 2023-05-22 NOTE — ED Triage Notes (Signed)
 Husband had sinus infection. Pt got sick Monday, reports drainage in back of throat. Feels congestion in back of throat. No fever. Throat not sore right now.

## 2023-05-22 NOTE — Discharge Instructions (Addendum)
 Advised patient to take medications as directed with food to completion.  Advised patient to take prednisone  and Allegra with first dose of doxycycline  for the next 5 of 7 days. Advised may use Allegra as needed afterwards for concurrent postnasal drainage/drip/runny nose.  Encouraged increase daily water intake to 64 ounces per day while taking these medications.  Advised if symptoms worsen and/or unresolved please follow-up with your PCP or here for further evaluation.

## 2023-05-22 NOTE — ED Provider Notes (Signed)
 [facility]    CSN: [id number] Arrival date & time: 05/22/23  2725      History   Chief Complaint Chief Complaint  Patient presents with   Sore Throat    HPI KENITHA BRUMMETT is a 63 y.o. female.   HPI Very pleasant 63 year old female presents with sore throat, postnasal drainage, and sinus nasal congestion for 8 days.  PMH significant for obesity, hypothyroidism, and vitamin B 12 deficiency.  Past Medical History:  Diagnosis Date   Hypothyroid     Patient Active Problem List   Diagnosis Date Noted   Traumatic tear of right rotator cuff 12/02/2022   Pre-diabetes 11/03/2022   Trouble in sleeping 05/23/2021   Elevated LDL cholesterol level 05/21/2021   Elevated blood pressure reading 05/20/2021   History of abnormal cervical Papanicolaou smear 05/19/2021   Vitamin D  deficiency 11/07/2018   Sinus drainage 04/15/2018   Seasonal allergies 04/15/2018   Class 1 obesity due to excess calories without serious comorbidity with body mass index (BMI) of 33.0 to 33.9 in adult 09/25/2017   Hot flashes 01/02/2017   B12 deficiency 01/02/2017   Overweight (BMI 25.0-29.9) 01/01/2017   Anterior cruciate ligament complete tear, left, initial encounter 12/17/2015   Elevated fasting glucose 02/19/2015   Hyperlipidemia 03/28/2014   Elevated liver enzymes 03/28/2014   Menopausal symptoms 09/12/2013   Hypothyroidism 12/09/2009    Past Surgical History:  Procedure Laterality Date   cyst removed under Right breast  1-11   TUBAL LIGATION      OB History   No obstetric history on file.      Home Medications    Prior to Admission medications   Medication Sig Start Date End Date Taking? Authorizing Provider  doxycycline  (VIBRAMYCIN ) 100 MG capsule Take 1 capsule (100 mg total) by mouth 2 (two) times daily for 7 days. 05/22/23 05/29/23 Yes Leonides Ramp, FNP  fexofenadine [facility] ALLERGY) 180 MG tablet Take 1 tablet (180 mg total) by mouth daily for 15 days. 05/22/23 06/06/23  Yes Leonides Ramp, FNP  predniSONE  (DELTASONE ) 20 MG tablet Take 3 tabs PO daily x 5 days. 05/22/23  Yes Leonides Ramp, FNP  cyanocobalamin  (VITAMIN B12) 500 MCG tablet Take 500 mcg by mouth daily.    [provider]  SYNTHROID  100 MCG tablet TAKE 1 TABLET BY MOUTH DAILY BEFORE BREAKFAST. 05/05/23   Breeback, Jade L, PA-C  Vitamin D , Ergocalciferol , (DRISDOL ) 1.25 MG (50000 UNIT) CAPS capsule TAKE 1 CAPSULE (50,000 UNITS TOTAL) BY MOUTH EVERY 7 (SEVEN) DAYS 10/01/22   Araceli Knight, PA-C    Family History Family History  Problem Relation Age of Onset   Cancer Mother        Lung    Social History Social History   Tobacco Use   Smoking status: Former    Current packs/day: 0.00    Types: Cigarettes    Quit date: 01/26/1982    Years since quitting: 41.3   Smokeless tobacco: Never  Substance Use Topics   Alcohol use: No   Drug use: No     Allergies   Rofecoxib and Augmentin  [amoxicillin -pot clavulanate]   Review of Systems Review of Systems  HENT:  Positive for congestion, sinus pressure and sore throat.   All other systems reviewed and are negative.    Physical Exam Triage Vital Signs ED Triage Vitals  Encounter Vitals Group     BP 05/22/23 0908 (!) 158/101     Systolic BP Percentile --  Diastolic BP Percentile --      Pulse Rate 05/22/23 0908 89     Resp 05/22/23 0908 16     Temp 05/22/23 0908 98 F (36.7 C)     Temp src --      SpO2 05/22/23 0908 98 %     Weight 05/22/23 0912 197 lb (89.4 kg)     Height 05/22/23 0912 5\' 2"  (1.575 m)     Head Circumference --      Peak Flow --      Pain Score 05/22/23 0911 0     Pain Loc --      Pain Education --      Exclude from Growth Chart --    No data found.  Updated Vital Signs BP (!) 158/101   Pulse 89   Temp 98 F (36.7 C)   Resp 16   Ht 5\' 2"  (1.575 m)   Wt 197 lb (89.4 kg)   LMP 05/09/2010   SpO2 98%   BMI 36.03 kg/m    Physical Exam Vitals and nursing note reviewed.  Constitutional:       General: She is not in acute distress.    Appearance: Normal appearance. She is obese. She is ill-appearing.  HENT:     Head: Normocephalic and atraumatic.     Right Ear: Tympanic membrane and external ear normal.     Left Ear: Tympanic membrane and external ear normal.     Ears:     Comments: Moderate eustachian tube dysfunction noted bilaterally    Nose:     Right Turbinates: Enlarged.     Left Turbinates: Enlarged.     Right Sinus: Maxillary sinus tenderness present.     Left Sinus: Maxillary sinus tenderness present.     Mouth/Throat:     Mouth: Mucous membranes are moist.     Pharynx: Oropharynx is clear.     Comments: Significant amount of clear drainage of posterior oropharynx noted Eyes:     Extraocular Movements: Extraocular movements intact.     Conjunctiva/sclera: Conjunctivae normal.     Pupils: Pupils are equal, round, and reactive to light.  Cardiovascular:     Rate and Rhythm: Normal rate and regular rhythm.     Pulses: Normal pulses.     Heart sounds: Normal heart sounds.  Pulmonary:     Effort: Pulmonary effort is normal.     Breath sounds: Normal breath sounds. No wheezing, rhonchi or rales.  Musculoskeletal:        General: Normal range of motion.     Cervical back: Normal range of motion and neck supple.  Skin:    General: Skin is warm and dry.  Neurological:     General: No focal deficit present.     Mental Status: She is alert and oriented to person, place, and time. Mental status is at baseline.  Psychiatric:        Mood and Affect: Mood normal.        Behavior: Behavior normal.      UC Treatments / Results  Labs (all labs ordered are listed, but only abnormal results are displayed) Labs Reviewed - No data to display  EKG   Radiology No results found.  Procedures Procedures (including critical care time)  Medications Ordered in UC Medications - No data to display  Initial Impression / Assessment and Plan / UC Course  I have  reviewed the triage vital signs and the nursing notes.  Pertinent labs & imaging  results that were available during my care of the patient were reviewed by me and considered in my medical decision making (see chart for details).     MDM: 1.  Acute maxillary sinusitis, recurrence not specified-Rx'd doxycycline  100 mg capsule: Take 1 capsule twice daily x 7 days; 2.  Congestion of nasal sinus-Rx'd prednisone  20 mg tablet: Take 3 tablets p.o. daily x 5 days; 3.  Allergic rhinitis, unspecified seasonality, unspecified trigger-Rx'd Allegra 180 mg fexofenadine daily x 5 days, then as needed. Advised patient to take medications as directed with food to completion.  Advised patient to take prednisone  and Allegra with first dose of doxycycline  for the next 5 of 7 days. Advised may use Allegra as needed afterwards for concurrent postnasal drainage/drip/runny nose.  Encouraged increase daily water intake to 64 ounces per day while taking these medications.  Advised if symptoms worsen and/or unresolved please follow-up with your PCP or here for further evaluation.  Patient discharged home, hemodynamically stable. Final Clinical Impressions(s) / UC Diagnoses   Final diagnoses:  Acute maxillary sinusitis, recurrence not specified  Congestion of nasal sinus  Allergic rhinitis, unspecified seasonality, unspecified trigger     Discharge Instructions      Advised patient to take medications as directed with food to completion.  Advised patient to take prednisone  and Allegra with first dose of doxycycline  for the next 5 of 7 days. Advised may use Allegra as needed afterwards for concurrent postnasal drainage/drip/runny nose.  Encouraged increase daily water intake to 64 ounces per day while taking these medications.  Advised if symptoms worsen and/or unresolved please follow-up with your PCP or here for further evaluation.     ED Prescriptions     Medication Sig Dispense Auth. Provider   doxycycline   (VIBRAMYCIN ) 100 MG capsule Take 1 capsule (100 mg total) by mouth 2 (two) times daily for 7 days. 14 capsule Roselene Gray, FNP   predniSONE  (DELTASONE ) 20 MG tablet Take 3 tabs PO daily x 5 days. 15 tablet Kariah Loredo, FNP   fexofenadine (ALLEGRA ALLERGY) 180 MG tablet Take 1 tablet (180 mg total) by mouth daily for 15 days. 15 tablet Dina Warbington, FNP      PDMP not reviewed this encounter.   Leonides Ramp, FNP 05/22/23 [phone]

## 2023-05-27 ENCOUNTER — Other Ambulatory Visit: Payer: Self-pay | Admitting: Gynecology

## 2023-05-27 DIAGNOSIS — Z1231 Encounter for screening mammogram for malignant neoplasm of breast: Secondary | ICD-10-CM

## 2023-06-06 ENCOUNTER — Other Ambulatory Visit: Payer: Self-pay | Admitting: Physician Assistant

## 2023-06-15 ENCOUNTER — Encounter: Payer: Self-pay | Admitting: Physician Assistant

## 2023-06-20 ENCOUNTER — Encounter: Payer: Self-pay | Admitting: Physician Assistant

## 2023-06-20 DIAGNOSIS — R300 Dysuria: Secondary | ICD-10-CM

## 2023-06-20 DIAGNOSIS — N898 Other specified noninflammatory disorders of vagina: Secondary | ICD-10-CM

## 2023-06-22 NOTE — Telephone Encounter (Signed)
 I sent note ok for labs ok to add UA and wet prep as well.

## 2023-06-22 NOTE — Telephone Encounter (Signed)
 Patient informed and will stop by office for lab work .

## 2023-06-22 NOTE — Telephone Encounter (Signed)
 Patient informed.

## 2023-06-23 DIAGNOSIS — E538 Deficiency of other specified B group vitamins: Secondary | ICD-10-CM | POA: Diagnosis not present

## 2023-06-23 DIAGNOSIS — E039 Hypothyroidism, unspecified: Secondary | ICD-10-CM | POA: Diagnosis not present

## 2023-06-23 DIAGNOSIS — R300 Dysuria: Secondary | ICD-10-CM | POA: Diagnosis not present

## 2023-06-24 LAB — MICROSCOPIC EXAMINATION
Bacteria, UA: NONE SEEN
Casts: NONE SEEN /LPF
Epithelial Cells (non renal): >10 /HPF — AB (ref 0–10)

## 2023-06-24 LAB — URINALYSIS, ROUTINE W REFLEX MICROSCOPIC
Bilirubin, UA: NEGATIVE
Glucose, UA: NEGATIVE
Ketones, UA: NEGATIVE
Nitrite, UA: NEGATIVE
Protein,UA: NEGATIVE
RBC, UA: NEGATIVE
Specific Gravity, UA: 1.021 (ref 1.005–1.030)
Urobilinogen, Ur: 0.2 mg/dL (ref 0.2–1.0)
pH, UA: 5.5 (ref 5.0–7.5)

## 2023-06-24 LAB — TSH+FREE T4
Free T4: 1.18 ng/dL (ref 0.82–1.77)
TSH: 3.09 u[IU]/mL (ref 0.450–4.500)

## 2023-06-24 LAB — B12 AND FOLATE PANEL
Folate: 9.5 ng/mL (ref >3.0)
Vitamin B-12: 299 pg/mL (ref 232–1245)

## 2023-06-25 ENCOUNTER — Ambulatory Visit: Payer: Self-pay | Admitting: Physician Assistant

## 2023-06-25 NOTE — Progress Notes (Signed)
 Thyroid  looks GREAT.  B12 MUCH better. What is your current dose? I would still like you above 400 if we can get you there.

## 2023-06-25 NOTE — Progress Notes (Signed)
 No bacteria, blood, protein in urine. Looks good.

## 2023-06-28 ENCOUNTER — Other Ambulatory Visit: Payer: Self-pay | Admitting: Physician Assistant

## 2023-06-28 DIAGNOSIS — E039 Hypothyroidism, unspecified: Secondary | ICD-10-CM

## 2023-06-29 NOTE — Addendum Note (Signed)
 Addended by: Deangleo Passage Z on: 06/29/2023 04:14 PM   Modules accepted: Orders

## 2023-07-12 ENCOUNTER — Ambulatory Visit

## 2023-08-05 ENCOUNTER — Ambulatory Visit
Admission: RE | Admit: 2023-08-05 | Discharge: 2023-08-05 | Disposition: A | Discharge status: Home / Self Care | Source: Ambulatory Visit | Attending: Gynecology | Admitting: Gynecology

## 2023-08-05 DIAGNOSIS — Z1231 Encounter for screening mammogram for malignant neoplasm of breast: Secondary | ICD-10-CM

## 2023-08-05 DIAGNOSIS — Z130 Encounter for screening for diseases of the blood and blood-forming organs and certain disorders involving the immune mechanism: Secondary | ICD-10-CM | POA: Diagnosis not present

## 2023-08-05 DIAGNOSIS — Z01419 Encounter for gynecological examination (general) (routine) without abnormal findings: Secondary | ICD-10-CM | POA: Diagnosis not present

## 2023-08-18 ENCOUNTER — Encounter: Payer: Self-pay | Admitting: Physician Assistant

## 2023-08-18 ENCOUNTER — Ambulatory Visit: Admitting: Physician Assistant

## 2023-08-18 VITALS — BP 155/87 | HR 69 | Ht 62.0 in | Wt 199.0 lb

## 2023-08-18 DIAGNOSIS — R030 Elevated blood-pressure reading, without diagnosis of hypertension: Secondary | ICD-10-CM | POA: Diagnosis not present

## 2023-08-18 DIAGNOSIS — E538 Deficiency of other specified B group vitamins: Secondary | ICD-10-CM | POA: Diagnosis not present

## 2023-08-18 DIAGNOSIS — R61 Generalized hyperhidrosis: Secondary | ICD-10-CM | POA: Diagnosis not present

## 2023-08-18 DIAGNOSIS — E782 Mixed hyperlipidemia: Secondary | ICD-10-CM

## 2023-08-18 DIAGNOSIS — R7303 Prediabetes: Secondary | ICD-10-CM

## 2023-08-18 DIAGNOSIS — H938X3 Other specified disorders of ear, bilateral: Secondary | ICD-10-CM | POA: Insufficient documentation

## 2023-08-18 DIAGNOSIS — E039 Hypothyroidism, unspecified: Secondary | ICD-10-CM

## 2023-08-18 DIAGNOSIS — R159 Full incontinence of feces: Secondary | ICD-10-CM | POA: Insufficient documentation

## 2023-08-18 DIAGNOSIS — R300 Dysuria: Secondary | ICD-10-CM

## 2023-08-18 DIAGNOSIS — H9193 Unspecified hearing loss, bilateral: Secondary | ICD-10-CM | POA: Insufficient documentation

## 2023-08-18 MED ORDER — LISINOPRIL 5 MG PO TABS: 5.0000 mg | ORAL_TABLET | Freq: Every day | ORAL | 1 refills | Status: DC

## 2023-08-18 MED ORDER — FLUTICASONE PROPIONATE 50 MCG/ACT NA SUSP: 2.0000 | Freq: Every day | NASAL | 0 refills | Status: DC

## 2023-08-18 NOTE — Progress Notes (Signed)
 Established Patient Office Visit  Subjective   Patient ID: Kelly Wheeler, female    DOB: 1960/07/14  Age: 63 y.o. MRN: [id number]  Chief Complaint  Patient presents with   Medical Management of Chronic Issues   Patient presents today for a follow-up with chronic issues. She has a PMH of hypothyroidism, B12 deficiency, Vitamin D  deficiency, Hyperlipidemia, elvated BP readings, and pre-diabetes. She complains of still having frequent night sweats, which have been getting worse. She states her sleep has also been poor and that she wakes up throughout the night.   Patient states she had an incident of unprovoked bowel leakage once and has since then been having occasional leaks in her underwear that she does not notice until she looks. She is unsure what may have caused this, but is curious is she should see GI for this issue. Patient states she has had an increase in her appetite, but there has been no changes in her diet or anything that she can think of that is causing her occasional bowel incontinence.   She states she has noticed that she cannot hear as well recently. She is no longer able to hear people talking in the other room as clearly. She denies any tinnitus, recent ear infections or URI, and was not sure if her ears were 'stopped up' with cerumen.   Patient has been taking her medication regularly and is taking B12 drops instead of the tablets.   Review of Systems  Constitutional:  Positive for diaphoresis.       Night sweats only, denies hot flashes during the day  HENT:  Positive for hearing loss. Negative for congestion, ear discharge, ear pain, sinus pain and tinnitus.        Bilateral hearing loss  Cardiovascular:  Negative for chest pain and palpitations.  Gastrointestinal:  Negative for abdominal pain, blood in stool, constipation, diarrhea, nausea and vomiting.       Occasional bowel leakage/ incontinence  Genitourinary:  Negative for dysuria.  Neurological:  Negative  for dizziness and headaches.  All other systems reviewed and are negative.     Objective:     BP (!) 155/87   Pulse 69   Ht 5' 2 (1.575 m)   Wt 199 lb (90.3 kg)   LMP 05/09/2010   SpO2 99%   BMI 36.40 kg/m  BP Readings from Last 3 Encounters:  08/18/23 (!) 155/87  05/22/23 (!) 158/101  11/25/22 (!) 147/88   Wt Readings from Last 3 Encounters:  08/18/23 199 lb (90.3 kg)  05/22/23 197 lb (89.4 kg)  11/02/22 194 lb (88 kg)      Physical Exam Constitutional:      Appearance: Normal appearance. She is obese.  HENT:     Head: Normocephalic.     Right Ear: Tympanic membrane, ear canal and external ear normal.     Left Ear: Ear canal and external ear normal.     Ears:     Comments: Slight clouding of the L TM, ossicles and cone of light visible. Patient unable to hearing tuning fork with attempted Weber test.    Nose: Nose normal.     Mouth/Throat:     Mouth: Mucous membranes are moist.  Cardiovascular:     Rate and Rhythm: Normal rate and regular rhythm.     Pulses: Normal pulses.     Heart sounds: Normal heart sounds.  Pulmonary:     Breath sounds: Normal breath sounds.  Abdominal:  General: Abdomen is flat.     Palpations: Abdomen is soft.  Musculoskeletal:        General: Normal range of motion.     Cervical back: Normal range of motion.  Skin:    General: Skin is warm and dry.  Neurological:     Mental Status: She is alert.  Psychiatric:        Mood and Affect: Mood normal.        Behavior: Behavior normal.      Last thyroid  functions Lab Results  Component Value Date   TSH 3.090 06/23/2023     The 10-year ASCVD risk score (Arnett DK, et al., 2019) is: 6.1%   Hearing Screening   500Hz  1000Hz  2000Hz  4000Hz   Right ear Fail Fail Fail Pass  Left ear Fail Fail Pass Pass       Assessment & Plan:  SABRASABRALowell was seen today for medical management of chronic issues.  Diagnoses and all orders for this visit:  Elevated blood pressure reading -      lisinopril  (ZESTRIL ) 5 MG tablet; Take 1 tablet (5 mg total) by mouth daily.  B12 deficiency -     B12 and Folate Panel  Hypothyroidism, unspecified type -     TSH + free T4  Pre-diabetes -     Hemoglobin A1c  Mixed hyperlipidemia -     Lipid panel  Night sweats -     Lipid panel -     B12 and Folate Panel -     CMP14+EGFR -     TSH + free T4 -     VITAMIN D  25 Hydroxy (Vit-D Deficiency, Fractures) -     CBC w/Diff/Platelet -     Hemoglobin A1c  Bilateral hearing loss, unspecified hearing loss type -     fluticasone  (FLONASE ) 50 MCG/ACT nasal spray; Place 2 sprays into both nostrils daily. -     Ambulatory referral to ENT  Fullness in both ears -     Ambulatory referral to ENT  Incontinence of feces, unspecified fecal incontinence type    Hypothyroidism  - Thyroid  labs checked today, last TSH: 3.090; last T4: 1.18 (06/23/2023) - Pt continuing Synthroid  100mg , will adjust as necessary based on thyroid  lab results   Elevated blood pressure  - BP: 151/87; BP at recheck: 155/87 - Starting lisinopril  5mg  today, sent into pharmacy - Educated patient on importance of keeping BP lower, encouraged checking at home  - Labs checked today: CBC, CMP, B12/ Folate, Vit D,   Hyperlipidemia - not at goal  - Lipid panel ordered today  - Last LDL: 142; Last Cholesterol: 240   Pre-diabetes - A1C ordered with labs (Last A1C: 5.9%)  Night sweats Poor sleep - Recommend taking ashwaghanda to help decrease cortisol increases at night and help with sleep and possible night sweats  Bowel leakage -encouraged patient to work on pelvic floor muscle tone - Recommended increasing daily fiber to help minimize soft stools - Will consider referral to GI is symptoms worsen   Bilateral hearing loss and ear fullness -TMs a little dull on exam, recommended trial of flonase  to help with fullness - Referral to ENT sent in  - Hearing test completed, results in chart showing hearing loss in both  ears  Return in about 4 weeks (around 09/15/2023).    Marypat Kimmet, PA-C

## 2023-08-18 NOTE — Patient Instructions (Addendum)
 Will refer out for hearing testing.  Start lisinopril  for blood pressure. Recheck in 4-6 weeks in office.  Start aswaganda 300mg  in the evenings before bed for sleep and night sweats Start flonase  daily until she sees ENT.

## 2023-08-19 LAB — CBC WITH DIFFERENTIAL/PLATELET
Basophils Absolute: 0 x10E3/uL (ref 0.0–0.2)
Basos: 1 %
EOS (ABSOLUTE): 0.1 x10E3/uL (ref 0.0–0.4)
Eos: 2 %
Hematocrit: 41.4 % (ref 34.0–46.6)
Hemoglobin: 13.5 g/dL (ref 11.1–15.9)
Immature Grans (Abs): 0 x10E3/uL (ref 0.0–0.1)
Immature Granulocytes: 0 %
Lymphocytes Absolute: 1.6 x10E3/uL (ref 0.7–3.1)
Lymphs: 29 %
MCH: 28.6 pg (ref 26.6–33.0)
MCHC: 32.6 g/dL (ref 31.5–35.7)
MCV: 88 fL (ref 79–97)
Monocytes Absolute: 0.4 x10E3/uL (ref 0.1–0.9)
Monocytes: 7 %
Neutrophils Absolute: 3.5 x10E3/uL (ref 1.4–7.0)
Neutrophils: 60 %
Platelets: 151 x10E3/uL (ref 150–450)
RBC: 4.72 x10E6/uL (ref 3.77–5.28)
RDW: 13.1 % (ref 11.7–15.4)
WBC: 5.7 x10E3/uL (ref 3.4–10.8)

## 2023-08-19 LAB — VITAMIN D 25 HYDROXY (VIT D DEFICIENCY, FRACTURES): Vit D, 25-Hydroxy: 27.6 ng/mL — ABNORMAL LOW (ref 30.0–100.0)

## 2023-08-19 LAB — CMP14+EGFR
ALT: 38 IU/L — ABNORMAL HIGH (ref 0–32)
AST: 37 IU/L (ref 0–40)
Albumin: 4.3 g/dL (ref 3.9–4.9)
Alkaline Phosphatase: 101 IU/L (ref 44–121)
BUN/Creatinine Ratio: 11 — ABNORMAL LOW (ref 12–28)
BUN: 9 mg/dL (ref 8–27)
Bilirubin Total: 0.5 mg/dL (ref 0.0–1.2)
CO2: 20 mmol/L (ref 20–29)
Calcium: 9.2 mg/dL (ref 8.7–10.3)
Chloride: 105 mmol/L (ref 96–106)
Creatinine, Ser: 0.83 mg/dL (ref 0.57–1.00)
Globulin, Total: 2.4 g/dL (ref 1.5–4.5)
Glucose: 109 mg/dL — ABNORMAL HIGH (ref 70–99)
Potassium: 4.6 mmol/L (ref 3.5–5.2)
Sodium: 142 mmol/L (ref 134–144)
Total Protein: 6.7 g/dL (ref 6.0–8.5)
eGFR: 79 mL/min/1.73 (ref >59)

## 2023-08-19 LAB — LIPID PANEL
Chol/HDL Ratio: 3.2 ratio (ref 0.0–4.4)
Cholesterol, Total: 228 mg/dL — ABNORMAL HIGH (ref 100–199)
HDL: 71 mg/dL (ref >39)
LDL Chol Calc (NIH): 141 mg/dL — ABNORMAL HIGH (ref 0–99)
Triglycerides: 91 mg/dL (ref 0–149)
VLDL Cholesterol Cal: 16 mg/dL (ref 5–40)

## 2023-08-19 LAB — HEMOGLOBIN A1C
Est. average glucose Bld gHb Est-mCnc: 128 mg/dL
Hgb A1c MFr Bld: 6.1 % — ABNORMAL HIGH (ref 4.8–5.6)

## 2023-08-19 LAB — B12 AND FOLATE PANEL
Folate: 10.1 ng/mL (ref >3.0)
Vitamin B-12: 242 pg/mL (ref 232–1245)

## 2023-08-19 LAB — TSH+FREE T4
Free T4: 1.51 ng/dL (ref 0.82–1.77)
TSH: 0.919 u[IU]/mL (ref 0.450–4.500)

## 2023-08-23 ENCOUNTER — Ambulatory Visit: Payer: Self-pay | Admitting: Physician Assistant

## 2023-08-23 DIAGNOSIS — E559 Vitamin D deficiency, unspecified: Secondary | ICD-10-CM

## 2023-08-23 MED ORDER — VITAMIN D (ERGOCALCIFEROL) 1.25 MG (50000 UNIT) PO CAPS: 50000.0000 [IU] | ORAL_CAPSULE | ORAL | 3 refills | Status: AC

## 2023-08-23 NOTE — Progress Notes (Signed)
 Meher,   Normal hemoglobin and Venne blood count.  Thyroid  looks good. Thyroid  medication refills were sent for 90 days in early June.   Cholesterol is stable. 10 year cardiovascular risk is under 7.5 percent. Continue working on diet and exercise changes.   SABRA.The 10-year ASCVD risk score (Arnett DK, et al., 2019) is: 6.2%   Values used to calculate the score:     Age: 63 years     Clincally relevant sex: Female     Is Non-Hispanic African American: No     Diabetic: No     Tobacco smoker: No     Systolic Blood Pressure: 155 mmHg     Is BP treated: No     HDL Cholesterol: 71 mg/dL     Total Cholesterol: 228 mg/dL  J8R, your sugar average, continues to elevate. You are still in pre-diabetes range. Work on regular exercise(150 minutes a week) and low carb/sugar diet. Recheck in 6 months if not decreasing could consider oral metformin.  Vitamin D  still low. Make sure taking the once weekly high dose vitamin D . Refills sent to pharmacy.  B12 is still on the low normal side. Make sure taking vitamin B12 1000mcg daily. Liquid drops are the best.

## 2023-09-13 DIAGNOSIS — H938X1 Other specified disorders of right ear: Secondary | ICD-10-CM | POA: Diagnosis not present

## 2023-09-13 DIAGNOSIS — L820 Inflamed seborrheic keratosis: Secondary | ICD-10-CM | POA: Diagnosis not present

## 2023-09-13 DIAGNOSIS — H903 Sensorineural hearing loss, bilateral: Secondary | ICD-10-CM | POA: Diagnosis not present

## 2023-09-28 ENCOUNTER — Encounter: Payer: Self-pay | Admitting: Sports Medicine

## 2023-09-29 ENCOUNTER — Ambulatory Visit: Admitting: Physician Assistant

## 2023-10-27 ENCOUNTER — Ambulatory Visit: Admitting: Physician Assistant

## 2023-12-06 ENCOUNTER — Ambulatory Visit: Admitting: Physician Assistant

## 2024-01-01 ENCOUNTER — Other Ambulatory Visit: Payer: Self-pay | Admitting: Physician Assistant

## 2024-01-06 ENCOUNTER — Encounter: Payer: Self-pay | Admitting: Physician Assistant

## 2024-01-06 NOTE — Telephone Encounter (Signed)
 Will patient need visit with provider or just lab work order? Also , is it o.k. to send a 30 day supply until the patient is to be seen if so?

## 2024-01-07 MED ORDER — SYNTHROID 100 MCG PO TABS: 100.0000 ug | ORAL_TABLET | Freq: Every day | ORAL | 1 refills | Status: AC

## 2024-01-14 ENCOUNTER — Encounter: Payer: Self-pay | Admitting: Physician Assistant

## 2024-01-14 NOTE — Telephone Encounter (Signed)
Ok for letter for jury duty.  

## 2024-01-14 NOTE — Telephone Encounter (Signed)
 Called the patient regarding the date and Juror number. Unable to provide the information due to her driving. Per the patient's request, will send her a MyChart msg with the necessary criteria to complete her jury duty letter.

## 2024-01-28 ENCOUNTER — Encounter: Payer: Self-pay | Admitting: Physician Assistant

## 2024-01-28 NOTE — Telephone Encounter (Signed)
 Patient picked up form today 01/28/24, thanks.

## 2024-02-16 ENCOUNTER — Encounter: Payer: Self-pay | Admitting: Physician Assistant

## 2024-02-16 ENCOUNTER — Ambulatory Visit: Admitting: Physician Assistant

## 2024-02-16 VITALS — BP 140/82 | HR 78 | Wt 201.2 lb

## 2024-02-16 DIAGNOSIS — E538 Deficiency of other specified B group vitamins: Secondary | ICD-10-CM | POA: Diagnosis not present

## 2024-02-16 DIAGNOSIS — E039 Hypothyroidism, unspecified: Secondary | ICD-10-CM

## 2024-02-16 DIAGNOSIS — E782 Mixed hyperlipidemia: Secondary | ICD-10-CM | POA: Diagnosis not present

## 2024-02-16 DIAGNOSIS — R7303 Prediabetes: Secondary | ICD-10-CM

## 2024-02-16 DIAGNOSIS — R61 Generalized hyperhidrosis: Secondary | ICD-10-CM

## 2024-02-16 DIAGNOSIS — E559 Vitamin D deficiency, unspecified: Secondary | ICD-10-CM | POA: Diagnosis not present

## 2024-02-16 LAB — POCT GLYCOSYLATED HEMOGLOBIN (HGB A1C): Hemoglobin A1C: 5.9 % — AB (ref 4.0–5.6)

## 2024-02-16 NOTE — Progress Notes (Signed)
 "  Established Patient Office Visit  Subjective   Patient ID: Kelly Wheeler, female    DOB: Mar 06, 1960  Age: 64 y.o. MRN: [id number]  Chief Complaint  Patient presents with   Medical Management of Chronic Issues    64 year old female presents for a 6 month follow-up for chronic conditions including hypothyroidism, hyperlipidemia, B12 deficiency, Vit D deficiency, night sweats, and pre diabetes.   Since our last visit, she reports feeling well. She has been taking her b/p at home occasionally with an average of the readings around 130/70-80. She has been attempting to incorporate more fruits and vegetables into her diet and has stopped drinking soda and sweet tea. She has not been exercising. She is not taking her lisinopril .   She is compliant with her synthroid  and Vit D, but is inconsistent with her Vit B12, admitting to forgetting to take it some days. She denies starting lisinopril .   Her primary concern today involves frequent night sweats and bouts of overheating and sweating while getting ready in the morning. She states that these symptoms presented initially during perimenopause, decreased some a few years ago, but returned to the same initial intensity over the past 1-2 years. She has not tried any otc supplements or medications for the night sweats.      Patient Active Problem List   Diagnosis Date Noted   Night sweats 08/18/2023   Bilateral hearing loss 08/18/2023   Fullness in both ears 08/18/2023   Incontinence of feces 08/18/2023   Traumatic tear of right rotator cuff 12/02/2022   Pre-diabetes 11/03/2022   Trouble in sleeping 05/23/2021   Elevated LDL cholesterol level 05/21/2021   Elevated blood pressure reading 05/20/2021   History of abnormal cervical Papanicolaou smear 05/19/2021   Vitamin D  deficiency 11/07/2018   Sinus drainage 04/15/2018   Seasonal allergies 04/15/2018   Class 1 obesity due to excess calories without serious comorbidity with body mass index  (BMI) of 33.0 to 33.9 in adult 09/25/2017   Hot flashes 01/02/2017   B12 deficiency 01/02/2017   Overweight (BMI 25.0-29.9) 01/01/2017   Anterior cruciate ligament complete tear, left, initial encounter 12/17/2015   Elevated fasting glucose 02/19/2015   Hyperlipidemia 03/28/2014   Elevated liver enzymes 03/28/2014   Menopausal symptoms 09/12/2013   Hypothyroidism 12/09/2009   Allergies[1]  ROS See HPI   Objective:     BP (!) 140/82   Pulse 78   Wt 201 lb 4 oz (91.3 kg)   LMP 05/09/2010   SpO2 94%   BMI 36.81 kg/m  BP Readings from Last 3 Encounters:  02/16/24 (!) 140/82  08/18/23 (!) 155/87  05/22/23 (!) 158/101   Wt Readings from Last 3 Encounters:  02/16/24 201 lb 4 oz (91.3 kg)  08/18/23 199 lb (90.3 kg)  05/22/23 197 lb (89.4 kg)    .SABRA Results for orders placed or performed in visit on 02/16/24  POCT HgB A1C   Collection Time: 02/16/24  4:31 PM  Result Value Ref Range   Hemoglobin A1C 5.9 (A) 4.0 - 5.6 %   HbA1c POC (<> result, manual entry)     HbA1c, POC (prediabetic range)     HbA1c, POC (controlled diabetic range)       Physical Exam Constitutional:      Appearance: Normal appearance. She is obese.  HENT:     Head: Normocephalic and atraumatic.  Eyes:     Conjunctiva/sclera: Conjunctivae normal.     Pupils: Pupils are equal, round, and reactive to light.  Cardiovascular:     Rate and Rhythm: Normal rate and regular rhythm.     Heart sounds: Normal heart sounds.  Pulmonary:     Effort: Pulmonary effort is normal.     Breath sounds: Normal breath sounds.  Skin:    General: Skin is warm and dry.  Neurological:     Mental Status: She is alert.  Psychiatric:        Mood and Affect: Mood normal.        Behavior: Behavior normal.        Judgment: Judgment normal.      The 10-year ASCVD risk score (Arnett DK, et al., 2019) is: 5.1%    Assessment & Plan:  Kelly Wheeler was seen today for medical management of chronic issues.  Diagnoses and all  orders for this visit:  Pre-diabetes -     POCT HgB A1C -     CMP14+EGFR  Vitamin D  deficiency -     VITAMIN D  25 Hydroxy (Vit-D Deficiency, Fractures) -     CMP14+EGFR  B12 deficiency -     Vitamin B12 -     CMP14+EGFR  Hypothyroidism, unspecified type -     CMP14+EGFR  Mixed hyperlipidemia -     Lipid panel -     CMP14+EGFR  Night sweats -     CMP14+EGFR   - POCT HgbA1c at goal today (5.9); continue lifestyle management for prediabetes  - B/P elevated at 1st reading: 139/82 and 2nd reading: 140/82 - discussed importance of lowering B/P to goal (<130/80) to reduce risk of adverse outcomes such as TIA and MI - encouraged lifestyle modification including DASH diet and incorporating exercise such as walking to help with elevated B/P and hyperlipidemia - She does not want to start medication at this time - monitor B/P at home and start lisinopril  5 mg if readings average > 130/80 - labs ordered today include lipid panel, Vit B12, and CMP to monitor hyperlipidemia and Vit B12 levels (as patient has been inconsistent with taking supplement)  - discussed otc supplements such as Black Kohosh and/or Ashwaganda for night sweats and overheating; consider Veozah if symptoms persist or worsen.  - continue Synthroid  100 mg; most recent TSH to goal (07/2023): 0.919 - hyperlipidemia not to goal - most recent (07/2023) cholesterol: 228 LDL 141; continue to monitor, ASCVD risk low at this time at 5.1%  Return in about 6 months (around 08/15/2024).    Kelly Kilian, PA-C     [1]  Allergies Allergen Reactions   Rofecoxib Itching   Augmentin  [Amoxicillin -Pot Clavulanate]     Diarrhea/stomach upset   "

## 2024-02-16 NOTE — Patient Instructions (Signed)
 For hot flashes consider [employer] and/or Ashwaganda  Managing Your Hypertension Hypertension, also called high blood pressure, is when the force of the blood pressing against the walls of the arteries is too strong. Arteries are blood vessels that carry blood from your heart throughout your body. Hypertension forces the heart to work harder to pump blood and may cause the arteries to become narrow or stiff. Understanding blood pressure readings A blood pressure reading includes a higher number over a lower number: The first, or top, number is called the systolic pressure. It is a measure of the pressure in your arteries as your heart beats. The second, or bottom number, is called the diastolic pressure. It is a measure of the pressure in your arteries as the heart relaxes. For most people, a normal blood pressure is below 120/80. Your personal target blood pressure may vary depending on your medical conditions, your age, and other factors. Blood pressure is classified into four stages. Based on your blood pressure reading, your health care provider may use the following stages to determine what type of treatment you need, if any. Systolic pressure and diastolic pressure are measured in a unit called millimeters of mercury (mmHg). Normal Systolic pressure: below 120. Diastolic pressure: below 80. Elevated Systolic pressure: 120-129. Diastolic pressure: below 80. Hypertension stage 1 Systolic pressure: 130-139. Diastolic pressure: 80-89. Hypertension stage 2 Systolic pressure: 140 or above. Diastolic pressure: 90 or above. How can this condition affect me? Managing your hypertension is very important. Over time, hypertension can damage the arteries and decrease blood flow to parts of the body, including the brain, heart, and kidneys. Having untreated or uncontrolled hypertension can lead to: A heart attack. A stroke. A weakened blood vessel (aneurysm). Heart failure. Kidney damage. Eye  damage. Memory and concentration problems. Vascular dementia. What actions can I take to manage this condition? Hypertension can be managed by making lifestyle changes and possibly by taking medicines. Your health care provider will help you make a plan to bring your blood pressure within a normal range. You may be referred for counseling on a healthy diet and physical activity. Nutrition  Eat a diet that is high in fiber and potassium, and low in salt (sodium), added sugar, and fat. An example eating plan is called the DASH diet. DASH stands for Dietary Approaches to Stop Hypertension. To eat this way: Eat plenty of fresh fruits and vegetables. Try to fill one-half of your plate at each meal with fruits and vegetables. Eat whole grains, such as whole-wheat pasta, brown rice, or whole-grain bread. Fill about one-fourth of your plate with whole grains. Eat low-fat dairy products. Avoid fatty cuts of meat, processed or cured meats, and poultry with skin. Fill about one-fourth of your plate with lean proteins such as fish, chicken without skin, beans, eggs, and tofu. Avoid pre-made and processed foods. These tend to be higher in sodium, added sugar, and fat. Reduce your daily sodium intake. Many people with hypertension should eat less than 1,500 mg of sodium a day. Lifestyle  Work with your health care provider to maintain a healthy body weight or to lose weight. Ask what an ideal weight is for you. Get at least 30 minutes of exercise that causes your heart to beat faster (aerobic exercise) most days of the week. Activities may include walking, swimming, or biking. Include exercise to strengthen your muscles (resistance exercise), such as weight lifting, as part of your weekly exercise routine. Try to do these types of exercises for  30 minutes at least 3 days a week. Do not use any products that contain nicotine or tobacco. These products include cigarettes, chewing tobacco, and vaping devices, such  as e-cigarettes. If you need help quitting, ask your health care provider. Control any long-term (chronic) conditions you have, such as high cholesterol or diabetes. Identify your sources of stress and find ways to manage stress. This may include meditation, deep breathing, or making time for fun activities. Alcohol use Do not drink alcohol if: Your health care provider tells you not to drink. You are pregnant, may be pregnant, or are planning to become pregnant. If you drink alcohol: Limit how much you have to: 0-1 drink a day for women. 0-2 drinks a day for men. Know how much alcohol is in your drink. In the [place] one drink equals one 12 oz bottle of beer (355 mL), one 5 oz glass of wine (148 mL), or one 1 oz glass of hard liquor (44 mL). Medicines Your health care provider may prescribe medicine if lifestyle changes are not enough to get your blood pressure under control and if: Your systolic blood pressure is 130 or higher. Your diastolic blood pressure is 80 or higher. Take medicines only as told by your health care provider. Follow the directions carefully. Blood pressure medicines must be taken as told by your health care provider. The medicine does not work as well when you skip doses. Skipping doses also puts you at risk for problems. Monitoring Before you monitor your blood pressure: Do not smoke, drink caffeinated beverages, or exercise within 30 minutes before taking a measurement. Use the bathroom and empty your bladder (urinate). Sit quietly for at least 5 minutes before taking measurements. Monitor your blood pressure at home as told by your health care provider. To do this: Sit with your back straight and supported. Place your feet flat on the floor. Do not cross your legs. Support your arm on a flat surface, such as a table. Make sure your upper arm is at heart level. Each time you measure, take two or three readings one minute apart and record the results. You may also  need to have your blood pressure checked regularly by your health care provider. General information Talk with your health care provider about your diet, exercise habits, and other lifestyle factors that may be contributing to hypertension. Review all the medicines you take with your health care provider because there may be side effects or interactions. Keep all follow-up visits. Your health care provider can help you create and adjust your plan for managing your high blood pressure. Where to find more information National [employer], [employer], [employer]: popsteam.is American Heart Association: www.heart.org Contact a health care provider if: You think you are having a reaction to medicines you have taken. You have repeated (recurrent) headaches. You feel dizzy. You have swelling in your ankles. You have trouble with your vision. Get help right away if: You develop a severe headache or confusion. You have unusual weakness or numbness, or you feel faint. You have severe pain in your chest or abdomen. You vomit repeatedly. You have trouble breathing. These symptoms may be an emergency. Get help right away. Call 911. Do not wait to see if the symptoms will go away. Do not drive yourself to the hospital. Summary Hypertension is when the force of blood pumping through your arteries is too strong. If this condition is not controlled, it may put you at risk for serious complications. Your personal target  blood pressure may vary depending on your medical conditions, your age, and other factors. For most people, a normal blood pressure is less than 120/80. Hypertension is managed by lifestyle changes, medicines, or both. Lifestyle changes to help manage hypertension include losing weight, eating a healthy, low-sodium diet, exercising more, stopping smoking, and limiting alcohol. This information is not intended to replace advice given to you by your health care provider. Make sure you  discuss any questions you have with your health care provider. Document Revised: 09/26/2020 Document Reviewed: 09/26/2020 [employer] Patient Education  2024 [employer].

## 2024-08-16 ENCOUNTER — Ambulatory Visit: Admitting: Physician Assistant
